# Patient Record
Sex: Female | Born: 1935 | Race: White | Hispanic: No | Smoking: Never smoker
Health system: Southern US, Community
[De-identification: ages and names within clinical notes are randomized; demographics above are authoritative.]

## PROBLEM LIST (undated history)

## (undated) DIAGNOSIS — F419 Anxiety disorder, unspecified: Secondary | ICD-10-CM

## (undated) DIAGNOSIS — I10 Essential (primary) hypertension: Secondary | ICD-10-CM

## (undated) DIAGNOSIS — F32A Depression, unspecified: Secondary | ICD-10-CM

## (undated) DIAGNOSIS — K219 Gastro-esophageal reflux disease without esophagitis: Secondary | ICD-10-CM

## (undated) DIAGNOSIS — R5381 Other malaise: Secondary | ICD-10-CM

## (undated) DIAGNOSIS — N390 Urinary tract infection, site not specified: Secondary | ICD-10-CM

## (undated) DIAGNOSIS — K3189 Other diseases of stomach and duodenum: Secondary | ICD-10-CM

## (undated) DIAGNOSIS — G4700 Insomnia, unspecified: Secondary | ICD-10-CM

## (undated) DIAGNOSIS — K5792 Diverticulitis of intestine, part unspecified, without perforation or abscess without bleeding: Secondary | ICD-10-CM

## (undated) DIAGNOSIS — K529 Noninfective gastroenteritis and colitis, unspecified: Secondary | ICD-10-CM

## (undated) DIAGNOSIS — F1110 Opioid abuse, uncomplicated: Secondary | ICD-10-CM

## (undated) DIAGNOSIS — F329 Major depressive disorder, single episode, unspecified: Secondary | ICD-10-CM

## (undated) DIAGNOSIS — F39 Unspecified mood [affective] disorder: Secondary | ICD-10-CM

## (undated) DIAGNOSIS — N179 Acute kidney failure, unspecified: Secondary | ICD-10-CM

## (undated) DIAGNOSIS — E039 Hypothyroidism, unspecified: Secondary | ICD-10-CM

## (undated) DIAGNOSIS — R52 Pain, unspecified: Secondary | ICD-10-CM

## (undated) DIAGNOSIS — Z8719 Personal history of other diseases of the digestive system: Secondary | ICD-10-CM

## (undated) HISTORY — PX: COLON RESECTION: SHX5231

## (undated) HISTORY — DX: Hypothyroidism, unspecified: E03.9

## (undated) HISTORY — DX: Essential (primary) hypertension: I10

## (undated) HISTORY — DX: Major depressive disorder, single episode, unspecified: F32.9

## (undated) HISTORY — DX: Gastro-esophageal reflux disease without esophagitis: K21.9

## (undated) HISTORY — DX: Other diseases of stomach and duodenum: K31.89

## (undated) HISTORY — DX: Diverticulitis of intestine, part unspecified, without perforation or abscess without bleeding: K57.92

## (undated) HISTORY — DX: Depression, unspecified: F32.A

---

## 1998-07-10 ENCOUNTER — Other Ambulatory Visit: Admission: RE | Admit: 1998-07-10 | Discharge: 1998-07-10 | Payer: Self-pay | Admitting: Gastroenterology

## 1998-07-12 ENCOUNTER — Encounter: Payer: Self-pay | Admitting: Gastroenterology

## 1998-07-12 ENCOUNTER — Ambulatory Visit (HOSPITAL_COMMUNITY): Admission: RE | Admit: 1998-07-12 | Discharge: 1998-07-12 | Payer: Self-pay | Admitting: Gastroenterology

## 1999-10-13 ENCOUNTER — Emergency Department (HOSPITAL_COMMUNITY): Admission: EM | Admit: 1999-10-13 | Discharge: 1999-10-13 | Payer: Self-pay | Admitting: Emergency Medicine

## 2000-06-04 ENCOUNTER — Encounter: Payer: Self-pay | Admitting: Emergency Medicine

## 2000-06-04 ENCOUNTER — Inpatient Hospital Stay (HOSPITAL_COMMUNITY): Admission: EM | Admit: 2000-06-04 | Discharge: 2000-06-06 | Payer: Self-pay | Admitting: Emergency Medicine

## 2000-06-05 ENCOUNTER — Encounter: Payer: Self-pay | Admitting: Internal Medicine

## 2000-11-12 ENCOUNTER — Encounter: Payer: Self-pay | Admitting: Gastroenterology

## 2000-11-12 ENCOUNTER — Encounter: Admission: RE | Admit: 2000-11-12 | Discharge: 2000-11-12 | Payer: Self-pay | Admitting: Gastroenterology

## 2001-01-31 ENCOUNTER — Inpatient Hospital Stay (HOSPITAL_COMMUNITY): Admission: EM | Admit: 2001-01-31 | Discharge: 2001-02-04 | Payer: Self-pay | Admitting: Emergency Medicine

## 2001-01-31 ENCOUNTER — Encounter: Payer: Self-pay | Admitting: Emergency Medicine

## 2001-02-01 ENCOUNTER — Encounter: Payer: Self-pay | Admitting: Internal Medicine

## 2001-09-19 ENCOUNTER — Inpatient Hospital Stay (HOSPITAL_COMMUNITY): Admission: EM | Admit: 2001-09-19 | Discharge: 2001-09-21 | Payer: Self-pay | Admitting: Emergency Medicine

## 2001-09-19 ENCOUNTER — Encounter: Payer: Self-pay | Admitting: Emergency Medicine

## 2001-09-20 ENCOUNTER — Encounter: Payer: Self-pay | Admitting: Gastroenterology

## 2001-09-21 ENCOUNTER — Encounter: Payer: Self-pay | Admitting: Gastroenterology

## 2001-09-21 ENCOUNTER — Encounter: Payer: Self-pay | Admitting: Internal Medicine

## 2001-09-30 ENCOUNTER — Encounter: Payer: Self-pay | Admitting: Gastroenterology

## 2001-09-30 ENCOUNTER — Inpatient Hospital Stay (HOSPITAL_COMMUNITY): Admission: EM | Admit: 2001-09-30 | Discharge: 2001-10-07 | Payer: Self-pay | Admitting: Emergency Medicine

## 2001-10-01 ENCOUNTER — Encounter: Payer: Self-pay | Admitting: Gastroenterology

## 2001-10-01 ENCOUNTER — Encounter: Payer: Self-pay | Admitting: Family Medicine

## 2001-10-02 ENCOUNTER — Encounter: Payer: Self-pay | Admitting: Gastroenterology

## 2001-10-03 ENCOUNTER — Encounter: Payer: Self-pay | Admitting: Gastroenterology

## 2001-10-04 ENCOUNTER — Encounter: Payer: Self-pay | Admitting: Gastroenterology

## 2001-10-08 ENCOUNTER — Encounter (INDEPENDENT_AMBULATORY_CARE_PROVIDER_SITE_OTHER): Payer: Self-pay | Admitting: *Deleted

## 2001-10-08 ENCOUNTER — Encounter: Payer: Self-pay | Admitting: Internal Medicine

## 2001-10-08 ENCOUNTER — Inpatient Hospital Stay (HOSPITAL_COMMUNITY): Admission: EM | Admit: 2001-10-08 | Discharge: 2001-10-11 | Payer: Self-pay | Admitting: *Deleted

## 2001-10-09 ENCOUNTER — Encounter: Payer: Self-pay | Admitting: Internal Medicine

## 2001-10-13 ENCOUNTER — Encounter: Admission: RE | Admit: 2001-10-13 | Discharge: 2001-10-13 | Payer: Self-pay | Admitting: Gastroenterology

## 2001-10-13 ENCOUNTER — Encounter: Payer: Self-pay | Admitting: Gastroenterology

## 2002-02-24 ENCOUNTER — Encounter: Payer: Self-pay | Admitting: Emergency Medicine

## 2002-02-24 ENCOUNTER — Emergency Department (HOSPITAL_COMMUNITY): Admission: EM | Admit: 2002-02-24 | Discharge: 2002-02-24 | Payer: Self-pay | Admitting: Emergency Medicine

## 2002-02-25 ENCOUNTER — Inpatient Hospital Stay (HOSPITAL_COMMUNITY): Admission: AD | Admit: 2002-02-25 | Discharge: 2002-03-01 | Payer: Self-pay | Admitting: Gastroenterology

## 2002-02-26 ENCOUNTER — Encounter: Payer: Self-pay | Admitting: Gastroenterology

## 2003-04-10 ENCOUNTER — Observation Stay (HOSPITAL_COMMUNITY): Admission: AD | Admit: 2003-04-10 | Discharge: 2003-04-11 | Payer: Self-pay | Admitting: Emergency Medicine

## 2003-09-10 ENCOUNTER — Inpatient Hospital Stay (HOSPITAL_COMMUNITY): Admission: EM | Admit: 2003-09-10 | Discharge: 2003-09-12 | Payer: Self-pay | Admitting: Emergency Medicine

## 2003-09-11 ENCOUNTER — Encounter: Payer: Self-pay | Admitting: Internal Medicine

## 2003-09-12 ENCOUNTER — Encounter (INDEPENDENT_AMBULATORY_CARE_PROVIDER_SITE_OTHER): Payer: Self-pay | Admitting: Specialist

## 2003-10-06 ENCOUNTER — Inpatient Hospital Stay (HOSPITAL_COMMUNITY): Admission: EM | Admit: 2003-10-06 | Discharge: 2003-10-07 | Payer: Self-pay

## 2004-02-14 ENCOUNTER — Ambulatory Visit: Payer: Self-pay | Admitting: Gastroenterology

## 2004-02-15 ENCOUNTER — Inpatient Hospital Stay (HOSPITAL_COMMUNITY): Admission: EM | Admit: 2004-02-15 | Discharge: 2004-02-19 | Payer: Self-pay | Admitting: Emergency Medicine

## 2004-03-04 ENCOUNTER — Ambulatory Visit: Payer: Self-pay | Admitting: Gastroenterology

## 2004-04-09 ENCOUNTER — Encounter: Admission: RE | Admit: 2004-04-09 | Discharge: 2004-04-09 | Payer: Self-pay | Admitting: Surgery

## 2004-05-03 ENCOUNTER — Ambulatory Visit: Payer: Self-pay | Admitting: Family Medicine

## 2004-05-05 ENCOUNTER — Inpatient Hospital Stay (HOSPITAL_COMMUNITY): Admission: EM | Admit: 2004-05-05 | Discharge: 2004-05-07 | Payer: Self-pay | Admitting: *Deleted

## 2004-05-14 ENCOUNTER — Ambulatory Visit: Payer: Self-pay | Admitting: Family Medicine

## 2004-05-15 ENCOUNTER — Ambulatory Visit: Payer: Self-pay

## 2004-07-01 ENCOUNTER — Ambulatory Visit: Payer: Self-pay | Admitting: Family Medicine

## 2004-09-02 ENCOUNTER — Inpatient Hospital Stay (HOSPITAL_COMMUNITY): Admission: EM | Admit: 2004-09-02 | Discharge: 2004-09-05 | Payer: Self-pay | Admitting: Emergency Medicine

## 2004-09-03 ENCOUNTER — Ambulatory Visit: Payer: Self-pay | Admitting: Internal Medicine

## 2004-09-23 ENCOUNTER — Ambulatory Visit: Payer: Self-pay | Admitting: Family Medicine

## 2004-09-27 ENCOUNTER — Ambulatory Visit: Payer: Self-pay | Admitting: Family Medicine

## 2004-10-10 ENCOUNTER — Encounter: Admission: RE | Admit: 2004-10-10 | Discharge: 2004-10-10 | Payer: Self-pay | Admitting: Surgery

## 2004-11-28 ENCOUNTER — Inpatient Hospital Stay (HOSPITAL_COMMUNITY): Admission: EM | Admit: 2004-11-28 | Discharge: 2004-12-01 | Payer: Self-pay | Admitting: *Deleted

## 2004-11-29 ENCOUNTER — Encounter: Payer: Self-pay | Admitting: Internal Medicine

## 2004-12-01 ENCOUNTER — Ambulatory Visit: Payer: Self-pay | Admitting: *Deleted

## 2004-12-03 ENCOUNTER — Ambulatory Visit: Payer: Self-pay | Admitting: Family Medicine

## 2004-12-10 ENCOUNTER — Ambulatory Visit: Payer: Self-pay | Admitting: Family Medicine

## 2004-12-16 ENCOUNTER — Inpatient Hospital Stay (HOSPITAL_COMMUNITY): Admission: EM | Admit: 2004-12-16 | Discharge: 2004-12-20 | Payer: Self-pay | Admitting: Emergency Medicine

## 2004-12-19 ENCOUNTER — Ambulatory Visit: Payer: Self-pay | Admitting: Gastroenterology

## 2004-12-20 ENCOUNTER — Encounter: Payer: Self-pay | Admitting: Gastroenterology

## 2005-01-13 ENCOUNTER — Ambulatory Visit: Payer: Self-pay | Admitting: Family Medicine

## 2005-03-24 ENCOUNTER — Inpatient Hospital Stay (HOSPITAL_COMMUNITY): Admission: EM | Admit: 2005-03-24 | Discharge: 2005-03-25 | Payer: Self-pay | Admitting: Emergency Medicine

## 2005-04-28 ENCOUNTER — Ambulatory Visit: Payer: Self-pay | Admitting: Family Medicine

## 2005-07-24 ENCOUNTER — Ambulatory Visit: Payer: Self-pay | Admitting: Family Medicine

## 2005-08-04 ENCOUNTER — Ambulatory Visit: Payer: Self-pay | Admitting: Family Medicine

## 2005-09-02 ENCOUNTER — Ambulatory Visit: Payer: Self-pay | Admitting: Internal Medicine

## 2005-09-22 ENCOUNTER — Emergency Department (HOSPITAL_COMMUNITY): Admission: EM | Admit: 2005-09-22 | Discharge: 2005-09-23 | Payer: Self-pay | Admitting: Emergency Medicine

## 2005-09-22 ENCOUNTER — Ambulatory Visit: Payer: Self-pay | Admitting: Family Medicine

## 2005-09-23 ENCOUNTER — Ambulatory Visit: Payer: Self-pay | Admitting: Family Medicine

## 2005-09-23 ENCOUNTER — Ambulatory Visit: Payer: Self-pay | Admitting: *Deleted

## 2005-09-24 ENCOUNTER — Inpatient Hospital Stay (HOSPITAL_COMMUNITY): Admission: EM | Admit: 2005-09-24 | Discharge: 2005-09-27 | Payer: Self-pay | Admitting: Emergency Medicine

## 2005-09-30 ENCOUNTER — Ambulatory Visit: Payer: Self-pay | Admitting: Family Medicine

## 2005-10-06 ENCOUNTER — Ambulatory Visit: Payer: Self-pay | Admitting: Family Medicine

## 2005-10-22 ENCOUNTER — Ambulatory Visit: Payer: Self-pay | Admitting: Family Medicine

## 2005-10-30 ENCOUNTER — Ambulatory Visit: Payer: Self-pay | Admitting: Family Medicine

## 2005-11-10 ENCOUNTER — Ambulatory Visit: Payer: Self-pay | Admitting: Family Medicine

## 2005-11-11 ENCOUNTER — Ambulatory Visit: Payer: Self-pay | Admitting: Family Medicine

## 2005-11-18 ENCOUNTER — Encounter: Admission: RE | Admit: 2005-11-18 | Discharge: 2005-11-18 | Payer: Self-pay | Admitting: Family Medicine

## 2005-11-18 ENCOUNTER — Ambulatory Visit: Payer: Self-pay | Admitting: Family Medicine

## 2005-11-25 ENCOUNTER — Ambulatory Visit: Payer: Self-pay | Admitting: Family Medicine

## 2005-12-09 ENCOUNTER — Ambulatory Visit: Payer: Self-pay | Admitting: Family Medicine

## 2005-12-18 ENCOUNTER — Ambulatory Visit: Payer: Self-pay | Admitting: Family Medicine

## 2006-01-14 ENCOUNTER — Ambulatory Visit: Payer: Self-pay | Admitting: Internal Medicine

## 2006-01-14 ENCOUNTER — Inpatient Hospital Stay (HOSPITAL_COMMUNITY): Admission: EM | Admit: 2006-01-14 | Discharge: 2006-01-17 | Payer: Self-pay | Admitting: Emergency Medicine

## 2006-01-30 ENCOUNTER — Ambulatory Visit: Payer: Self-pay | Admitting: Family Medicine

## 2006-01-30 LAB — CONVERTED CEMR LAB
Alkaline Phosphatase: 69 units/L (ref 39–117)
BUN: 28 mg/dL — ABNORMAL HIGH (ref 6–23)
CO2: 20 meq/L (ref 19–32)
Calcium: 9.2 mg/dL (ref 8.4–10.5)
Chloride: 111 meq/L (ref 96–112)
Creatinine, Ser: 1.3 mg/dL — ABNORMAL HIGH (ref 0.4–1.2)
Folate: 12.5 ng/mL
GFR calc non Af Amer: 42 mL/min
Total Protein: 6.6 g/dL (ref 6.0–8.3)
Vitamin B-12: >1500 pg/mL — ABNORMAL HIGH (ref 211–911)

## 2006-03-02 ENCOUNTER — Ambulatory Visit: Payer: Self-pay | Admitting: Family Medicine

## 2006-04-03 DIAGNOSIS — R519 Headache, unspecified: Secondary | ICD-10-CM | POA: Insufficient documentation

## 2006-04-03 DIAGNOSIS — M810 Age-related osteoporosis without current pathological fracture: Secondary | ICD-10-CM | POA: Insufficient documentation

## 2006-04-03 DIAGNOSIS — R51 Headache: Secondary | ICD-10-CM

## 2006-04-03 DIAGNOSIS — B0229 Other postherpetic nervous system involvement: Secondary | ICD-10-CM

## 2006-04-03 DIAGNOSIS — K219 Gastro-esophageal reflux disease without esophagitis: Secondary | ICD-10-CM

## 2006-04-03 DIAGNOSIS — Z8669 Personal history of other diseases of the nervous system and sense organs: Secondary | ICD-10-CM

## 2006-04-03 DIAGNOSIS — N390 Urinary tract infection, site not specified: Secondary | ICD-10-CM

## 2006-04-03 DIAGNOSIS — K5730 Diverticulosis of large intestine without perforation or abscess without bleeding: Secondary | ICD-10-CM | POA: Insufficient documentation

## 2006-04-03 DIAGNOSIS — Z8719 Personal history of other diseases of the digestive system: Secondary | ICD-10-CM | POA: Insufficient documentation

## 2006-04-03 DIAGNOSIS — M1990 Unspecified osteoarthritis, unspecified site: Secondary | ICD-10-CM | POA: Insufficient documentation

## 2006-04-07 ENCOUNTER — Encounter: Admission: RE | Admit: 2006-04-07 | Discharge: 2006-04-07 | Payer: Self-pay | Admitting: Family Medicine

## 2006-04-07 ENCOUNTER — Ambulatory Visit: Payer: Self-pay | Admitting: Family Medicine

## 2006-04-08 ENCOUNTER — Encounter: Payer: Self-pay | Admitting: Family Medicine

## 2006-04-28 ENCOUNTER — Ambulatory Visit: Payer: Self-pay | Admitting: Family Medicine

## 2006-05-14 ENCOUNTER — Ambulatory Visit: Payer: Self-pay | Admitting: Family Medicine

## 2006-05-16 ENCOUNTER — Encounter: Payer: Self-pay | Admitting: Family Medicine

## 2006-07-13 ENCOUNTER — Ambulatory Visit: Payer: Self-pay | Admitting: Family Medicine

## 2006-07-15 ENCOUNTER — Ambulatory Visit: Payer: Self-pay | Admitting: Internal Medicine

## 2006-07-15 LAB — CONVERTED CEMR LAB
CO2: 25 meq/L (ref 19–32)
Creatinine, Ser: 1.2 mg/dL (ref 0.4–1.2)
Folate: 9.8 ng/mL
GFR calc Af Amer: 56 mL/min
Potassium: 4.8 meq/L (ref 3.5–5.1)
Sodium: 140 meq/L (ref 135–145)
TSH: 3.07 microintl units/mL (ref 0.35–5.50)

## 2006-08-13 ENCOUNTER — Encounter: Payer: Self-pay | Admitting: Family Medicine

## 2006-08-17 ENCOUNTER — Ambulatory Visit: Payer: Self-pay | Admitting: Family Medicine

## 2006-08-24 ENCOUNTER — Ambulatory Visit: Payer: Self-pay | Admitting: Internal Medicine

## 2006-08-24 ENCOUNTER — Inpatient Hospital Stay (HOSPITAL_COMMUNITY): Admission: EM | Admit: 2006-08-24 | Discharge: 2006-08-26 | Payer: Self-pay | Admitting: Emergency Medicine

## 2006-08-25 ENCOUNTER — Encounter: Payer: Self-pay | Admitting: Internal Medicine

## 2006-09-02 ENCOUNTER — Encounter: Payer: Self-pay | Admitting: Family Medicine

## 2006-09-02 DIAGNOSIS — N318 Other neuromuscular dysfunction of bladder: Secondary | ICD-10-CM

## 2006-09-04 ENCOUNTER — Ambulatory Visit: Payer: Self-pay | Admitting: Gastroenterology

## 2006-09-28 ENCOUNTER — Ambulatory Visit: Payer: Self-pay | Admitting: Internal Medicine

## 2006-09-28 LAB — CONVERTED CEMR LAB
CO2: 23 meq/L (ref 19–32)
Calcium: 9.3 mg/dL (ref 8.4–10.5)
Chloride: 108 meq/L (ref 96–112)
Glucose, Bld: 86 mg/dL (ref 70–99)

## 2006-10-28 ENCOUNTER — Ambulatory Visit: Payer: Self-pay | Admitting: Family Medicine

## 2006-10-28 ENCOUNTER — Telehealth (INDEPENDENT_AMBULATORY_CARE_PROVIDER_SITE_OTHER): Payer: Self-pay | Admitting: *Deleted

## 2006-10-28 DIAGNOSIS — M79609 Pain in unspecified limb: Secondary | ICD-10-CM | POA: Insufficient documentation

## 2006-10-28 LAB — CONVERTED CEMR LAB
Bilirubin Urine: NEGATIVE
Ketones, urine, test strip: NEGATIVE
Protein, U semiquant: NEGATIVE
Urobilinogen, UA: NEGATIVE

## 2006-10-29 DIAGNOSIS — S92909A Unspecified fracture of unspecified foot, initial encounter for closed fracture: Secondary | ICD-10-CM | POA: Insufficient documentation

## 2006-11-02 ENCOUNTER — Encounter: Payer: Self-pay | Admitting: Family Medicine

## 2007-01-12 ENCOUNTER — Ambulatory Visit (HOSPITAL_BASED_OUTPATIENT_CLINIC_OR_DEPARTMENT_OTHER): Admission: RE | Admit: 2007-01-12 | Discharge: 2007-01-12 | Payer: Self-pay | Admitting: Urology

## 2007-02-09 ENCOUNTER — Telehealth (INDEPENDENT_AMBULATORY_CARE_PROVIDER_SITE_OTHER): Payer: Self-pay | Admitting: *Deleted

## 2007-02-22 ENCOUNTER — Ambulatory Visit: Payer: Self-pay | Admitting: Family Medicine

## 2007-02-22 DIAGNOSIS — M545 Low back pain: Secondary | ICD-10-CM | POA: Insufficient documentation

## 2007-02-22 DIAGNOSIS — R5383 Other fatigue: Secondary | ICD-10-CM

## 2007-02-22 DIAGNOSIS — R5381 Other malaise: Secondary | ICD-10-CM | POA: Insufficient documentation

## 2007-02-23 ENCOUNTER — Encounter: Payer: Self-pay | Admitting: Family Medicine

## 2007-02-24 LAB — CONVERTED CEMR LAB
BUN: 22 mg/dL (ref 6–23)
CO2: 22 meq/L (ref 19–32)
Creatinine, Ser: 1.3 mg/dL — ABNORMAL HIGH (ref 0.4–1.2)
Eosinophils Absolute: 1.7 10*3/uL — ABNORMAL HIGH (ref 0.0–0.6)
Eosinophils Relative: 18.8 % — ABNORMAL HIGH (ref 0.0–5.0)
HCT: 33.6 % — ABNORMAL LOW (ref 36.0–46.0)
Hemoglobin: 11.6 g/dL — ABNORMAL LOW (ref 12.0–15.0)
Lymphocytes Relative: 19 % (ref 12.0–46.0)
MCV: 94.2 fL (ref 78.0–100.0)
Neutro Abs: 4.8 10*3/uL (ref 1.4–7.7)
Neutrophils Relative %: 54.8 % (ref 43.0–77.0)
Potassium: 4.6 meq/L (ref 3.5–5.1)
Sodium: 134 meq/L — ABNORMAL LOW (ref 135–145)
WBC: 8.8 10*3/uL (ref 4.5–10.5)

## 2007-03-04 ENCOUNTER — Telehealth: Payer: Self-pay | Admitting: Internal Medicine

## 2007-03-08 ENCOUNTER — Ambulatory Visit: Payer: Self-pay | Admitting: Internal Medicine

## 2007-03-08 DIAGNOSIS — M4120 Other idiopathic scoliosis, site unspecified: Secondary | ICD-10-CM | POA: Insufficient documentation

## 2007-03-09 ENCOUNTER — Encounter: Payer: Self-pay | Admitting: Internal Medicine

## 2007-03-15 ENCOUNTER — Encounter (INDEPENDENT_AMBULATORY_CARE_PROVIDER_SITE_OTHER): Payer: Self-pay | Admitting: *Deleted

## 2007-04-01 ENCOUNTER — Telehealth (INDEPENDENT_AMBULATORY_CARE_PROVIDER_SITE_OTHER): Payer: Self-pay | Admitting: *Deleted

## 2007-05-13 ENCOUNTER — Telehealth (INDEPENDENT_AMBULATORY_CARE_PROVIDER_SITE_OTHER): Payer: Self-pay | Admitting: *Deleted

## 2007-05-18 ENCOUNTER — Telehealth (INDEPENDENT_AMBULATORY_CARE_PROVIDER_SITE_OTHER): Payer: Self-pay | Admitting: *Deleted

## 2007-05-24 ENCOUNTER — Ambulatory Visit: Payer: Self-pay | Admitting: Family Medicine

## 2007-05-24 DIAGNOSIS — I7789 Other specified disorders of arteries and arterioles: Secondary | ICD-10-CM

## 2007-05-24 DIAGNOSIS — E039 Hypothyroidism, unspecified: Secondary | ICD-10-CM | POA: Insufficient documentation

## 2007-05-26 ENCOUNTER — Encounter (INDEPENDENT_AMBULATORY_CARE_PROVIDER_SITE_OTHER): Payer: Self-pay | Admitting: *Deleted

## 2007-05-27 ENCOUNTER — Telehealth (INDEPENDENT_AMBULATORY_CARE_PROVIDER_SITE_OTHER): Payer: Self-pay | Admitting: *Deleted

## 2007-05-27 LAB — CONVERTED CEMR LAB: Free T4: 0.8 ng/dL (ref 0.6–1.6)

## 2007-06-01 ENCOUNTER — Telehealth (INDEPENDENT_AMBULATORY_CARE_PROVIDER_SITE_OTHER): Payer: Self-pay | Admitting: *Deleted

## 2007-06-03 ENCOUNTER — Telehealth (INDEPENDENT_AMBULATORY_CARE_PROVIDER_SITE_OTHER): Payer: Self-pay | Admitting: *Deleted

## 2007-06-04 ENCOUNTER — Telehealth (INDEPENDENT_AMBULATORY_CARE_PROVIDER_SITE_OTHER): Payer: Self-pay | Admitting: *Deleted

## 2007-06-08 ENCOUNTER — Encounter: Payer: Self-pay | Admitting: Family Medicine

## 2007-07-02 DIAGNOSIS — K2980 Duodenitis without bleeding: Secondary | ICD-10-CM | POA: Insufficient documentation

## 2007-07-02 DIAGNOSIS — J309 Allergic rhinitis, unspecified: Secondary | ICD-10-CM | POA: Insufficient documentation

## 2007-07-02 DIAGNOSIS — K56609 Unspecified intestinal obstruction, unspecified as to partial versus complete obstruction: Secondary | ICD-10-CM | POA: Insufficient documentation

## 2007-07-02 DIAGNOSIS — R7309 Other abnormal glucose: Secondary | ICD-10-CM | POA: Insufficient documentation

## 2007-07-02 DIAGNOSIS — K277 Chronic peptic ulcer, site unspecified, without hemorrhage or perforation: Secondary | ICD-10-CM | POA: Insufficient documentation

## 2007-07-02 DIAGNOSIS — G2581 Restless legs syndrome: Secondary | ICD-10-CM

## 2007-07-03 ENCOUNTER — Encounter: Payer: Self-pay | Admitting: Family Medicine

## 2007-07-05 ENCOUNTER — Encounter: Payer: Self-pay | Admitting: Family Medicine

## 2007-10-07 ENCOUNTER — Ambulatory Visit (HOSPITAL_BASED_OUTPATIENT_CLINIC_OR_DEPARTMENT_OTHER): Admission: RE | Admit: 2007-10-07 | Discharge: 2007-10-07 | Payer: Self-pay | Admitting: Urology

## 2007-11-04 IMAGING — CT CT PELVIS W/ CM
1 of 3 series · 14 of 32 positions shown, 19 images · IV contrast (omnipaque)
Comparison: 01/14/2006

ABDOMEN CT WITH CONTRAST:

CLINICAL DATA: Abdominal pain and vomiting
TECHNIQUE: Multidetector CT imaging of the abdomen and pelvis was performed
following the standard protocol during bolus administration of intravenous
contrast.

Contrast:  100 cc Omnipaque 300

[Series 2: abd_pel 5.0 b40f st · axial · 0.59mm/px · z∈[-326,+40]mm · 14 of 83 slices shown, 19 images]
[im 5/83  soft-tissue]
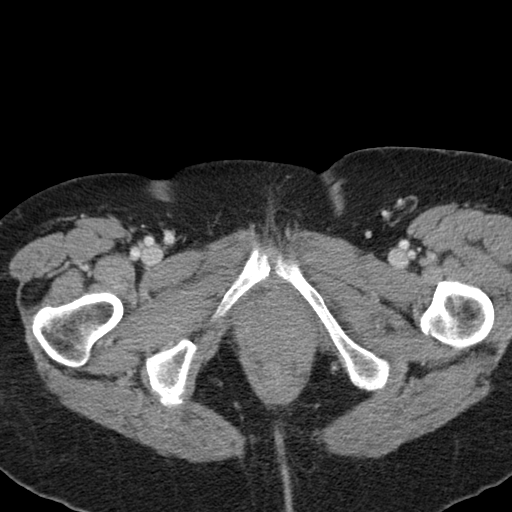
[im 5/83  bone]
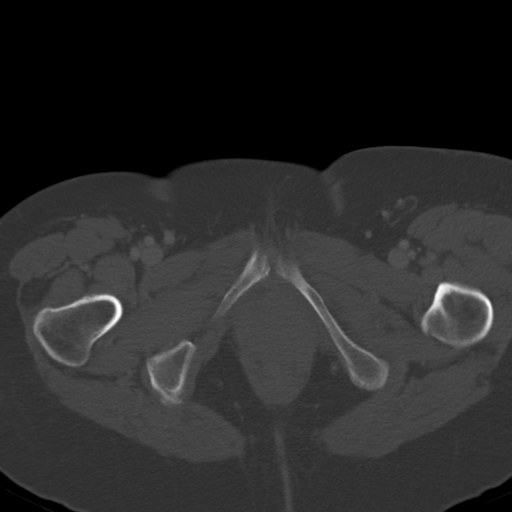
[im 10/83  soft-tissue]
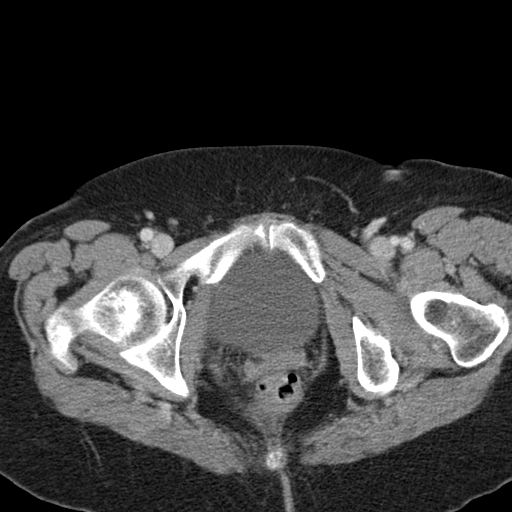
[im 19/83  soft-tissue]
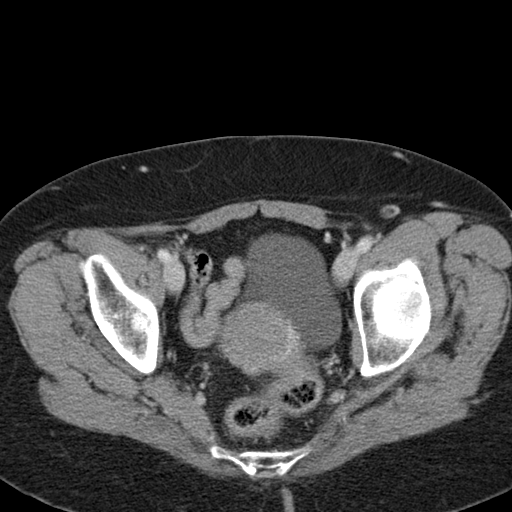
[im 23/83  soft-tissue]
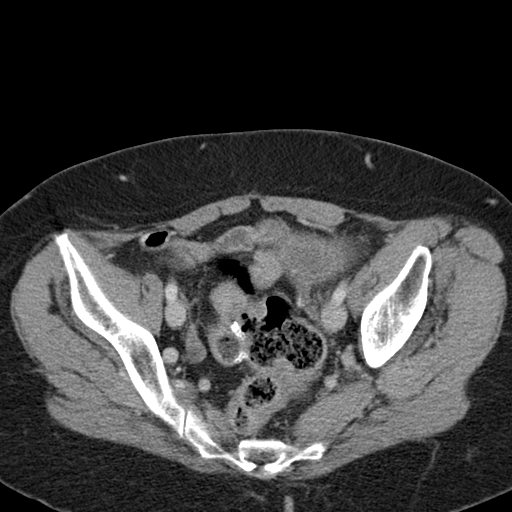
[im 28/83  soft-tissue]
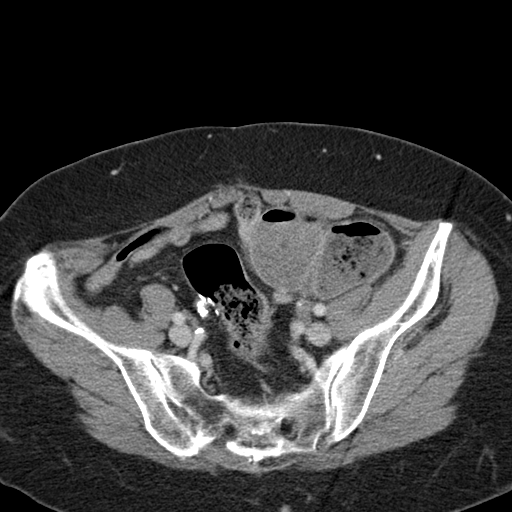
[im 37/83  soft-tissue]
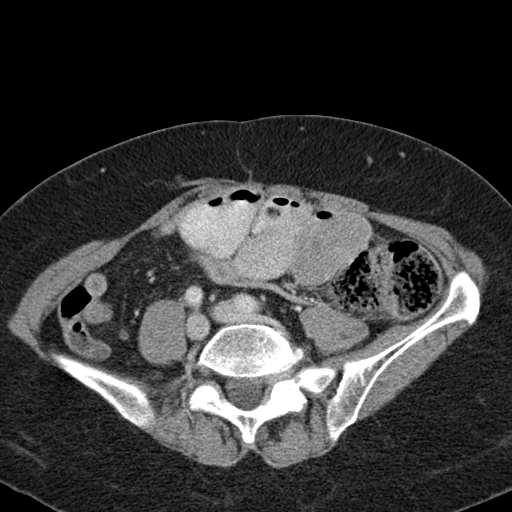
[im 42/83  soft-tissue]
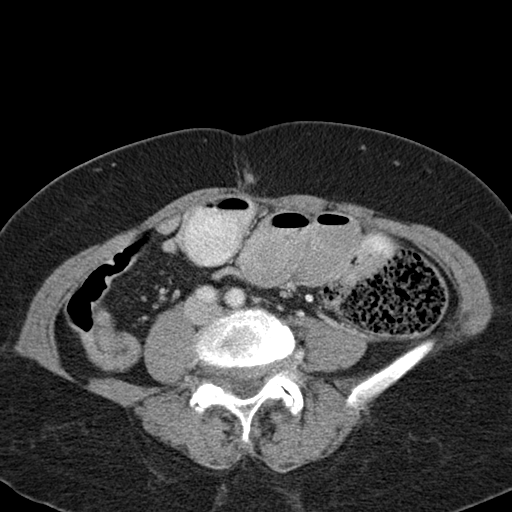
[im 46/83  soft-tissue]
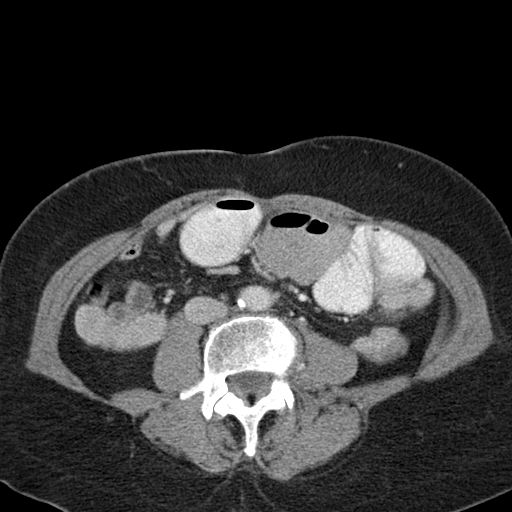
[im 55/83  soft-tissue]
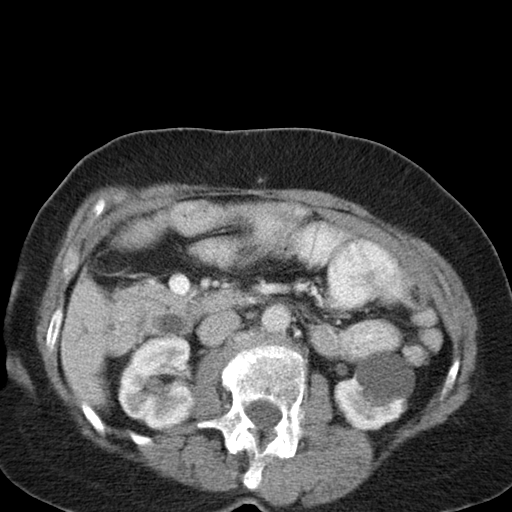
[im 55/83  bone]
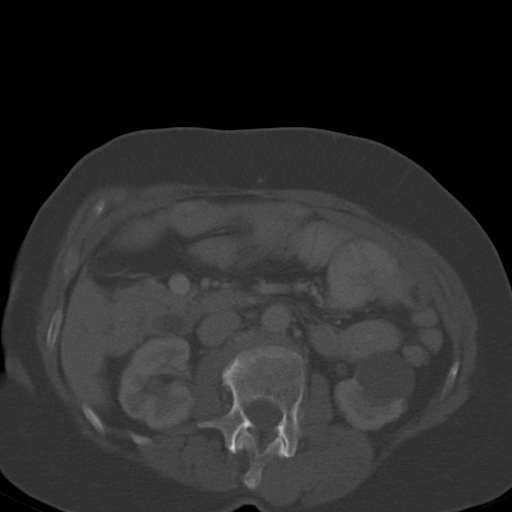
[im 60/83  soft-tissue]
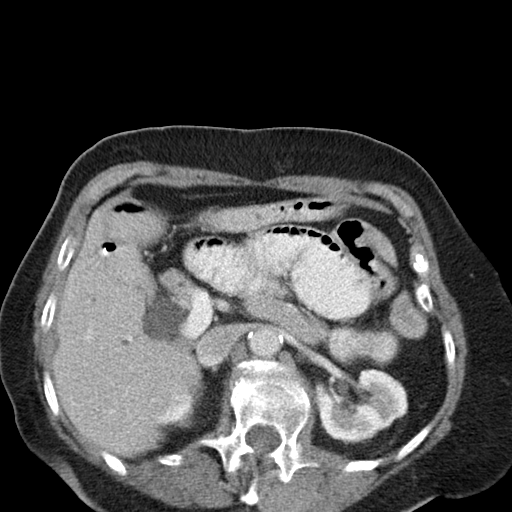
[im 64/83  soft-tissue]
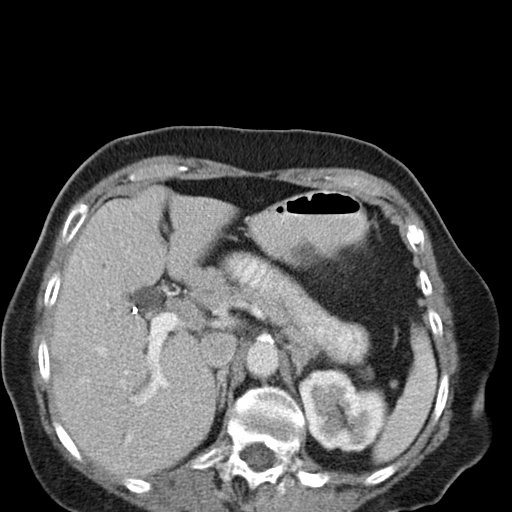
[im 64/83  lung]
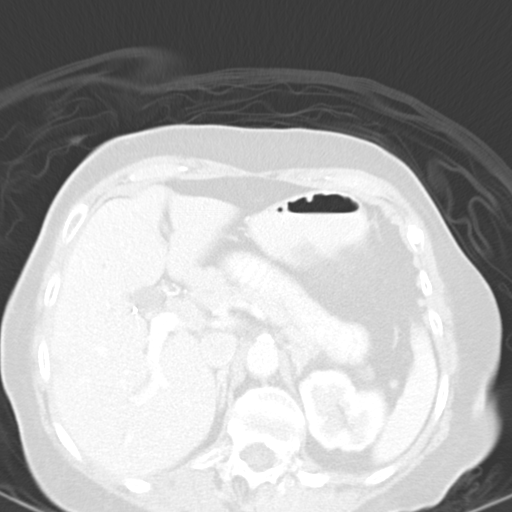
[im 69/83  lung]
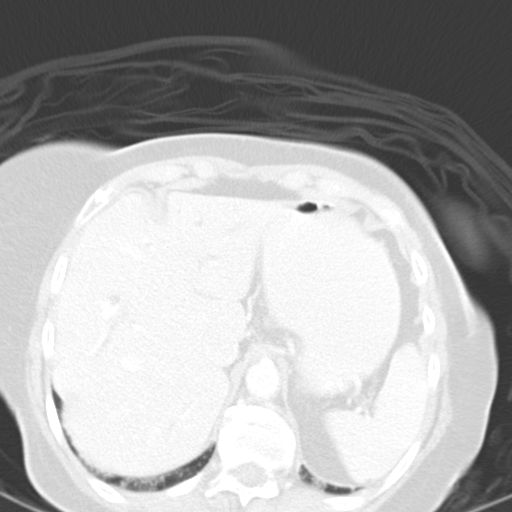
[im 73/83  soft-tissue]
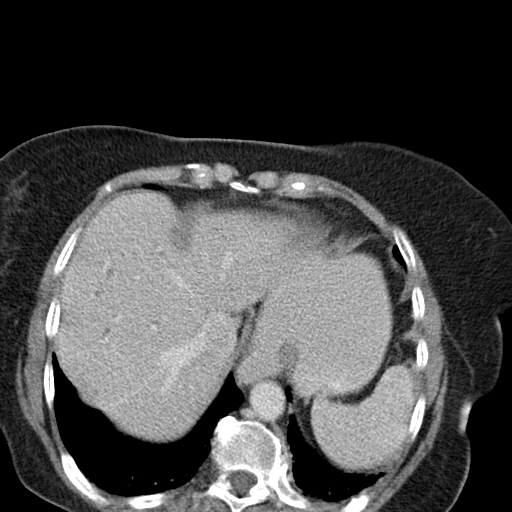
[im 73/83  lung]
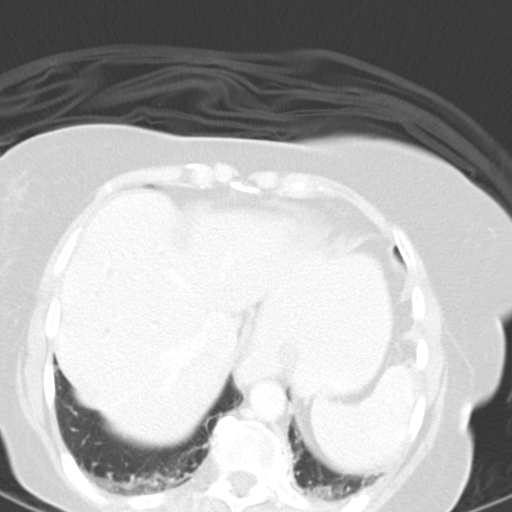
[im 78/83  soft-tissue]
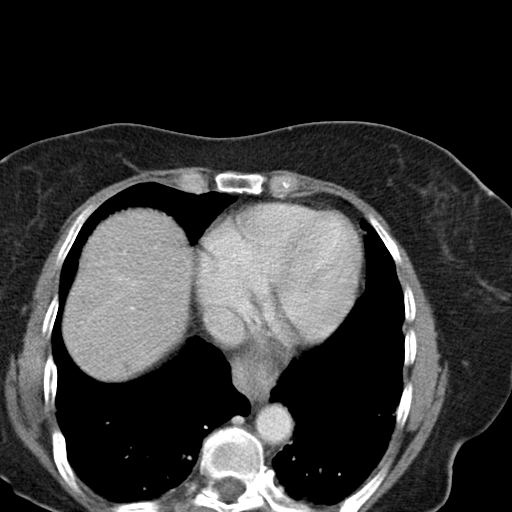
[im 78/83  lung]
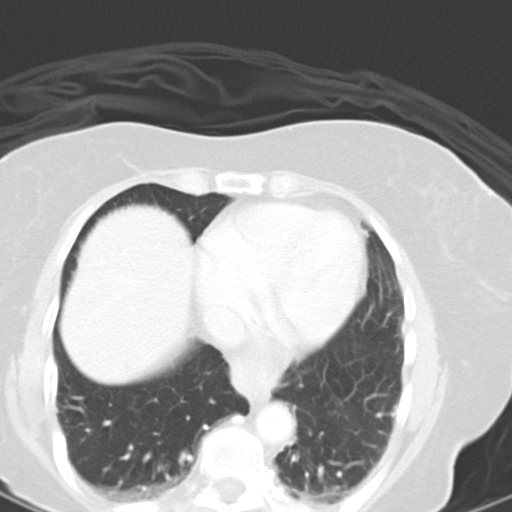

[14 of 32 positions shown; findings below may reference images not displayed]

FINDINGS: Intra and extrahepatic biliary dilatation seen on the previous study
persists, and is unchanged. Tiny low density lesion in the dome of the liver is
stable. No focal abnormality is seen in the spleen. Small to moderate hiatal
hernia noted. Patient is status post a subtotal colectomy with ileocolic
anastomosis noted at the level of the sigmoid colon.

The pancreas is unremarkable. The duodenal c-loop does not cross the midline as
expected, raising the question of malrotation. Dilated small bowel loops are
seen in the left abdomen. The patient has bilateral renal cysts with bilateral
renal scarring.
IMPRESSION: Small bowel dilation in the left abdomen.

PELVIS CT WITH CONTRAST:
FINDINGS: The patient has dilated small bowel in the anterior left lower
quadrant with a small bowel feces sign evident. The appearance is quite similar
to the previous exam. And ileocolic anastomosis is identified in the central
anatomic pelvis although the small bowel leading into the anastomosis is not
clearly visualized. Although a discrete transition point cannot be identified,
given the transition from opacified dilated small bowel to dilated unopacified
small bowel to dilated stool-filled small bowel , the level of obstruction is
probably in the left anatomic pelvis.

Bone windows are unremarkable.
IMPRESSION: Dilated small bowel in the left lower quadrant with an associated small bowel
feces sign. This is compatible with small bowel obstruction, although it is not
entirely clear whether this presents a complete mechanical obstruction or a
partial small bowel obstruction. The level of obstruction does appear proximal
to the ileocolic anastomosis. Imaging features are very similar to the previous
exam.

## 2008-06-22 ENCOUNTER — Ambulatory Visit (HOSPITAL_BASED_OUTPATIENT_CLINIC_OR_DEPARTMENT_OTHER): Admission: RE | Admit: 2008-06-22 | Discharge: 2008-06-22 | Payer: Self-pay | Admitting: Urology

## 2010-04-07 ENCOUNTER — Encounter: Payer: Self-pay | Admitting: Family Medicine

## 2010-06-26 LAB — POCT HEMOGLOBIN-HEMACUE: Hemoglobin: 14.4 g/dL (ref 12.0–15.0)

## 2010-07-30 NOTE — Assessment & Plan Note (Signed)
[facility]                         GASTROENTEROLOGY OFFICE NOTE   NAME:Miller, Janice RUMBAUGH                      MRN:          [id number]  DATE:09/28/2006                            DOB:          Mar 03, 1927    PROBLEMS:  See discharge summary of August 26, 2006 as well as Dr.  Blossom Hoops last note in 2005.   She is here to follow up after another admission for presumed small  bowel obstruction.  At that time, it was probably an ileus related to  the use of Enablex.  She is better and back to her normal 3 loose bowel  movements a day that are controllable.  She is concerned that she is  gaining weight.  She is 154 pounds.  In 2006 she was 161 pounds.  She  has been tired and drained.  Review of her labs in the hospital showed a  normal hemoglobin in June.  Her TSH was normal at the end of April.  She  did have renal insufficiency with a creatinine of 1.4.  She says she has  to have her labs drawn over here because of poor veins.   MEDICATIONS:  Are listed and reviewed in the chart.   PAST MEDICAL HISTORY:  As per problem list as described above.  This is  reviewed.   REVIEW OF SYSTEMS:  She still has urinary frequency and incontinence  issues. Not having any significant abdominal pain.   PHYSICAL:  Height 5 feet 1 inch.  Weight 154 pounds, pulse 72, blood  pressure 124/60.  The lower extremities are free of edema.   ASSESSMENT:  1. Chronic recurrent ileus and small bowel obstruction problems after      multiple surgical resections including a subtotal colectomy.  2. Chronic diarrhea symptoms, controlled.  3. Fatigue, some weight gain and fluctuation.  I suspect she is now      just returning after multiple illnesses, to a normal weight, she is      overweight with a BMI of 28, but she is under where she was a few      years ago.  I am not too concerned about the weight issue at this      point and I tried to reassure her.   PLAN:  Check a TSH,  check a BMET because of her creatinine problems.  She will have ongoing medical followup with Dr. Laury Axon and she will  follow up with Dr. Larey Dresser regarding her urinary tract problems.   She will return to me as needed at this point.     Iva Boop, MD,FACG  Electronically Signed    CEG/MedQ  DD: 09/28/2006  DT: 09/28/2006  Job #: [id number]   cc:   Maretta Bees. Vonita Moss, M.D.  Lelon Perla, DO
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
## 2010-07-30 NOTE — Op Note (Signed)
NAME:  [name], [name]               ACCOUNT NO.:  [id number]   MEDICAL RECORD NO.:  [id number]          PATIENT TYPE:  AMB   LOCATION:  [facility]                         FACILITY:  [facility]   PHYSICIAN:  Martina Sinner, MD DATE OF BIRTH:  September 16, 1926   DATE OF PROCEDURE:  06/22/2008  DATE OF DISCHARGE:                               OPERATIVE REPORT   PREOPERATIVE DIAGNOSES:  1. Neurogenic bladder.  2. Refractory urge incontinence.  3. Muscle spasms.   POSTOPERATIVE DIAGNOSES:  1. Neurogenic bladder.  2. Refractory urge incontinence.  3. Muscle spasms surgery cystoscopy, hydrodistention, Botox injection.   INDICATIONS FOR PROCEDURE:  The patient does very well with Botox.  She  has had breakthrough infections causing worsening urge incontinence.  She is asymptomatic today in regards to infection.   She has a long bowel history where she can have nausea and decreased  bowel movements and partial bowel obstructions.  This has been going on  for years but she really has not had a lot of trouble for the last 18  months.  She does not have a Development worker, international aid in [place] or [place]  currently.  She started having these symptoms last night.  She is  passing gas and had a small bowel movement this morning.  She is having  minimal pain.  Her and her son really want to go ahead with the  procedure and anesthesia and I both thought it was safe.  I gave her a  Dulcolax suppository in surgery.   DESCRIPTION OF PROCEDURE:  The patient underwent her procedure with the  ACMI scope.  Bladder mucosa and trigone were normal.  There is no  stitch, foreign body or carcinoma.  She is hydro distended to 600 mL.  Bladder was emptied.  There is no glomerulations.  With the bladder  partially full I injected 20 mL of saline with 200 units of Botox using  my usual template of 5 and 7 o'clock and cephalad to the interureteric  ridge.  There is no bleeding.  Bladder was emptied.  The patient is  taken to  recovery and was given preoperative ciprofloxacin.  She will  stay on Macrodantin daily.  I will see her as per protocol.           ______________________________  Martina Sinner, MD  Electronically Signed     SAM/MEDQ  D:  06/22/2008  T:  06/22/2008  Job:  [id number]

## 2010-07-30 NOTE — Discharge Summary (Signed)
[name], [name]               ACCOUNT NO.:  [id number]   MEDICAL RECORD NO.:  [id number]          PATIENT TYPE:  INP   LOCATION:  1321                         FACILITY:  [facility]   PHYSICIAN:  Valerie A. Felicity Coyer, MDDATE OF BIRTH:  [birth year]   DATE OF ADMISSION:  08/24/2006  DATE OF DISCHARGE:  08/26/2006                               DISCHARGE SUMMARY   DISCHARGE DIAGNOSES:  1. Recurrent partial small bowel obstruction by CT versus clinical      ileus (see details below), status post conservative medical      treatment with resolution.  2. Escherichia coli urinary tract infection, to treat with Cipro x5      days (see details below).  3. Nonspecific mild elevation of amylase and lipase (see below).  4. Hypothyroidism.  5. Restless leg syndrome  6. Elevated blood pressure at presentation, resolved without      treatment.  7. Osteoporosis.  8. Chronic benzodiazepine use.  9. History of idiopathic pancreatitis.  10.Gastroesophageal reflux disease.  11.History of subtotal colectomy in 1993 secondary to diverticulitis.  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  13.Status post cholecystectomy.   DISCHARGE MEDICATIONS:  1. Cipro 500 mg p.o. b.i.d. x5 days (prescription provided at time of      discharge).  (The patient is discontinued on her Enablex 7.5 mg daily which was begun  2 weeks ago for chronic urinary urgency with incontinence); doubt, but  cannot exclude, exacerbation of above symptoms due to this medication).  Other medications are as prior to admission without change and include:  1. Requip 1 mg p.o. t.i.d.  2. Klonopin 1.5 mg q.h.s.  3. Actonel 35 mg weekly.  4. Zyrtec 10 mg p.o. daily.  5. Synthroid 25 mcg daily.  6. Celebrex 200 mg p.o. b.i.d.  7. Prozac 40 mg daily.  8. Darvocet p.r.n.   DISPOSITION:  The patient is discharged home in medically stable and  improved condition.  She is having no further abdominal pain, no nausea  or  vomiting and tolerating a regular diet without difficulty.  Hospital  follow-up will be with primary care physician, Dr. Loreen Freud, to  arrange in the next 3 weeks as needed.  Also, with other physicians, Dr.  Cyndia Bent of surgery, Dr. Vonita Moss of urology and Dr.  Jarold Motto  of GI on an as-needed or as-previously-scheduled basis.   HOSPITAL COURSE BY PROBLEM:  1. Recurrent partial small bowel obstruction versus ileus.  The      patient is a 77 year old woman with long history of recurrent      partial small bowel obstruction, always in the left lower quadrant,      near the site of her previous anastomosis with multiple      hospitalizations related to same, most recent in October of 2007,      who presents to the [facility] emergency room day of admission due      to left lower quadrant pain associated with nausea and vomiting of      less than 24 hours onset.  Because of the similarity of this to  her      other episode of obstruction, she came to the emergency room for      further evaluation.  KUB failed to show any abnormality, so a CT      abdomen and pelvis was performed which did in fact show small bowel      obstruction changes.  As such, general surgery was called, but      having known the patient with conservative resolution over the past      previous hospitalizations, medicine was instead asked to admit with      surgery consult.  Upon review of Dr. Tenna Child notes, it is felt      that the patient has recurrence of these symptoms which appear to      be obstructive in nature, but clinically behave as an ileus,      usually in the setting of UTI.  The patient did have an initial      leukocytosis of 22.6 with 95% neutropenia and toxic granulation,      but no other clear evidence of infection, as the UA appeared      negative; however, it was sent for culture and did return with E.      coli sensitive to fluoroquinolones, which were then begun.      However, even  prior to the beginning of antibiotics, her white      count spontaneously resolved to normal at 7.4, thus likely      representing the elevation due to demargination with the acute      stress of ileus due to infection.  The patient was kept n.p.o. for      24 hours.  Lipase was mildly abnormal at 100, but again there was      no evidence of pancreatitis on the CT, and the patient's symptoms      rapidly resolved, not behaving as pancreatitis.  A follow-up lipase      level was normal.  On the day of discharge, amylase was also mildly      abnormal at 169, and upon recheck remained mildly abnormal at 206.      The significance of this is unclear, as clinically she has      improved.  Surgery has followed and agreed that diet could be      advanced as tolerated and is stable for discharge home with      treatment of her UTI as noted.  A GI consultation was also obtained      for further advice and recommendations on management, and their's      were as listed above.  At this time, tolerating a regular diet and      symptomatically resolved.  Will discontinued home.  We have      recommended the discontinuation of Enablex, as the patient reports      she has been on this medication for about 2 weeks related to her      mixed stress and urge incontinence and has noticed a change in her      chronic diarrhea to becoming constipated for her, only having a      bowel movement once daily.  The patient feels strongly convinced      that more than a UTI and Enablex may have triggered this      exacerbation of her symptoms, as she had not had any symptoms such      as this in almost year.  As  such, I have instructed the patient      that she may discontinue her Enablex at this time until further      evaluation with her urologist, Dr. Vonita Moss, as previously      scheduled.      Valerie A. Felicity Coyer, MD  Electronically Signed     VAL/MEDQ  D:  08/26/2006  T:  08/26/2006  Job:  [id number]

## 2010-07-30 NOTE — Op Note (Signed)
NAME:  ROSENA, BARTLE               ACCOUNT NO.:  [id number]   MEDICAL RECORD NO.:  [id number]          PATIENT TYPE:  AMB   LOCATION:  [facility]                         FACILITY:  [facility]   PHYSICIAN:  Martina Sinner, MD DATE OF BIRTH:  07-May-1926   DATE OF PROCEDURE:  01/12/2007  DATE OF DISCHARGE:                               OPERATIVE REPORT   PREOPERATIVE DIAGNOSIS:  Muscle spasm, neurogenic bladder, urge  incontinence.   POSTOPERATIVE DIAGNOSIS:  Muscle spasm, neurogenic bladder, urge  incontinence.   PROCEDURE:  Cystoscopy, bladder hydrodistention, injection of Botox.   Ms. Ramone has refractory overactive bladder.  She has neurogenic bladder  risk factors.  She was prepped and draped in usual fashion.  I used the  ACMI scope for the examination.  She was hydrodistended to 700 mL.  Interestingly, she actually had glomerulations which is the supporting  finding of interstitial cystitis.  200 units of Botox were injected with  20 mL saline in 20 sites throughout the lower half of the bladder,  sparing the trigone.  There was little to no bleeding.  The bladder was  emptied.  The patient was taken to recovery room.  Hopefully this will  reach her treatment goal.           ______________________________  Martina Sinner, MD  Electronically Signed     SAM/MEDQ  D:  01/12/2007  T:  01/12/2007  Job:  [id number]

## 2010-07-30 NOTE — Consult Note (Signed)
[name], FAKE NO.:  [id number]   MEDICAL RECORD NO.:  [id number]          PATIENT TYPE:  EMS   LOCATION:  ED                           [facility]ACILITY:  [name]   PHYSICIAN:  [name], [name]      DATE OF BIRTH:  Apr 07, 1926   DATE OF CONSULTATION:  08/24/2006  DATE OF DISCHARGE:                                 CONSULTATION   REFERRING PHYSICIANS:  Dr. Shelda Miller, [name], emergency department  and [name]. [name], [name], [name].   CHIEF COMPLAINT:  Abdominal pain.   HISTORY OF PRESENT ILLNESS:  [name] is a 77 year old white female  well-known to my partner, [name], for previous abdominal  surgery.  The patient has a complex past surgical history including  history of diverticular disease with colonic resection, history of  appendectomy, history of small bowel obstruction with two prior  laparotomies for lysis of adhesions, gynecologic procedures.  The  patient developed lower abdominal pain on June 8.  This was associated  with onset of diarrhea.  Pain persisted and onset of nausea and vomiting  occurred early this morning.  The patient presented to the emergency  department for assessment.  Initial abdominal x-rays were unrevealing.  However CT scan abdomen and pelvis showed findings consistent with at  least partial small-bowel obstruction.  General surgery was called for  evaluation.   The patient has a history of similar episodes.  All of these have been  associated with urinary tract infections.  Her last admission was  January 13, 2006 for similar symptoms which resolved with bowel rest and  intravenous hydration.  The patient is admitted at this time to the  medical service.  Of concern, however, there is a marked leukocytosis  noted on admission laboratories.   PAST MEDICAL HISTORY:  Status post appendectomy, status post colonic  resection for diverticular disease with perforation, status post  subtotal  colectomy 1993 in [place], [name], status post  cholecystectomy, status post exploratory laparotomy and lysis of  adhesions x2 by [name], history of idiopathic  pancreatitis, history of recurrent urinary tract infection, history of  gastroesophageal reflux disease, history of restless leg syndrome,  history of osteoporosis.   MEDICATIONS:  1. Zyrtec.  2. Requip.  3. Actonel.  4. Celebrex.  5. Clonazepam.  6. Darvocet.  7. Flexeril.  8. Prilosec.  9. Synthroid.  10.Prozac.  11.Enablex.   ALLERGIES:  None known.   SOCIAL HISTORY:  The patient lives in [place].  She is widowed.  She  is accompanied by her daughter.  She denies tobacco use.  She drinks  alcohol occasionally.   FAMILY HISTORY:  Unremarkable.   REVIEW OF SYSTEMS:  A 15 system review documented in [name]  admission history and physical without significant other findings.   PHYSICAL EXAMINATION:  GENERAL:  A 77 year old pleasant white female, on  a stretcher in the emergency department.  No apparent distress.  VITAL SIGNS:  Temperature 97.1, pulse 85, respirations 16, blood  pressure 176/97, O2 saturation 99% room air.  HEENT:  Shows her to be normocephalic, atraumatic.  Sclerae clear.  Conjunctiva clear.  Pupils equal and reactive.  Dentition fair.  Mucous  membranes moist.  Voice normal.  Palpation of the neck shows no thyroid  mass.  No nodularity.  No lymphadenopathy.  No tenderness.  CHEST:  Clear to auscultation bilaterally without rales, rhonchi or  wheeze.  Cardiac exam shows regular rate and rhythm without murmur.  Peripheral pulses are full.  ABDOMEN:  Mildly distended.  There are a few bowel sounds on  auscultation.  All surgical wounds appear well-healed without sign of  herniation.  There is mild to moderate tenderness in the left mid and  lower abdomen.  There is no palpable mass.  There is no guarding.  There  is no rebound tenderness.  EXTREMITIES:  Nontender  without edema.  NEUROLOGICALLY:  The patient is alert and oriented without focal  deficit.   LABORATORY STUDIES:  White count 22.6, hemoglobin 14.2, hematocrit  43.4%, platelet count 205,000.  Differential shows 95% neutrophils, 3%  lymphocytes, 2% monocytes.  Electrolytes are normal.  Urinalysis shows  few bacteria and zero to two white cells by high powered field.   RADIOGRAPHY:  Abdominal x-ray with nonspecific bowel gas pattern.  Stapled anastomosis in the low midline.   CT scan abdomen and pelvis showing findings consistent with high-grade  to partial small-bowel obstruction likely in the left lower quadrant.   IMPRESSION:  Partial small-bowel obstruction.   PLAN:  1. The patient will be admitted on [name] Service by Dr.      Rene Miller.  2. N.p.o. status, intravenous hydration.  3. Repeat abdominal x-ray in a.m. June 10.  4. May need nasogastric decompression if continued emesis.  5. Will follow closely with you.      [name], [name]  Electronically Signed     TMG/MEDQ  D:  08/24/2006  T:  08/25/2006  Job:  [id number]   cc:   Janice Ports A. [name], [name]  [address] [name], [place] [address]

## 2010-08-02 NOTE — Discharge Summary (Signed)
[name], [name]               ACCOUNT NO.:  [id number]   MEDICAL RECORD NO.:  [id number]          PATIENT TYPE:  INP   LOCATION:  3309                         FACILITY:  [facility]   PHYSICIAN:  Leonie Man, M.D.   DATE OF BIRTH:  11/19/1926   DATE OF ADMISSION:  11/28/2004  DATE OF DISCHARGE:  12/01/2004                                 DISCHARGE SUMMARY   DISCHARGE DIAGNOSES:  1.  Partial small-bowel obstruction, resolved.  2.  Chest pain, resolved.  3.  Urinary tract infection, treated.   HOSPITAL COURSE:  [name] is a 77 year old female who was admitted on  November 28, 2004 with a recurrent small-bowel obstruction.  She was  admitted on November 28, 2004 for NG tube decompression and IV hydration.  Spontaneously, the small-bowel obstruction did resolve.  She did have a  urinary tract infection and this was treated with Cipro.  During her  hospital stay, she did have some chest pain and she was seen in consultation  by [facility], Dr. Juanito Doom, who felt the chest pain was noncardiac  in nature.  By hospital day #3, the patient was in stable condition and felt  to be ready to discharge to home.   DISCHARGE MEDICATIONS:  She was continue her current medications as prior to  admission which included Actonel, clonazepam, Zyrtec, Celebrex, Prozac,  Requip, Prilosec, estrogen cream, Flexeril, Darvocet, Synthroid, Phenergan  and Vicodin as needed.      Guy Franco, P.A.      Leonie Man, M.D.  Electronically Signed    LB/MEDQ  D:  02/03/2005  T:  02/04/2005  Job:  [id number]

## 2010-08-02 NOTE — H&P (Signed)
NAMEDORATHA, MCSWAIN               ACCOUNT NO.:  [id number]   MEDICAL RECORD NO.:  [id number]          PATIENT TYPE:  INP   LOCATION:  1621                         FACILITY:  [facility]   PHYSICIAN:  Angelia Mould. Derrell Lolling, M.D.DATE OF BIRTH:  [birth year]   DATE OF ADMISSION:  01/13/2006  DATE OF DISCHARGE:                                HISTORY & PHYSICAL   CHIEF COMPLAINT:  Abdominal pain and vomiting.   HISTORY OF PRESENT ILLNESS:  This is a 77 year old white female who has a  history of recurrent small bowel obstructions in the past and has had  multiple laparotomies in the past for benign disease. She has had at least  two colon resections in her life, a cholecystectomy, and at least two lysis  of adhesions by Dr. Jamey Ripa. She has had two or three admissions in the last  two years for partial small bowel obstructions each time which resolved  spontaneously.   She has chronic diarrhea since 1983 because of her subtotal colectomy.  Yesterday she developed abdominal pain and passed two solid hard bowel  movements. She has not had any bowel movement since yesterday and has not  passed any gas since about 5 p.m. yesterday. She has vomited four times  small amounts bilious in nature. She came to the emergency room. She was  evaluated by the emergency department physician. A CT scan was obtained  which suggests a partial small-bowel obstruction with transition zone in the  left lower quadrant, but there is no sign of any inflammatory process or  tumor. There is stool in the colon remnant. Apparently she has had a  flexible sigmoidoscopy within the last year or two which showed the  anastomosis to be open.   IMPRESSION:  She is being admitted for further evaluation and management.   PAST MEDICAL HISTORY:  1. Remote appendectomy.  2. Some type of colon resection at age 10 for some type of perforation.  3. Completion of subtotal colectomy in 1993 for diverticulitis by Dr. Mikey Bussing in  [facility]. 4. Cholecystectomy with Dr. Jamey Ripa.  4. She has had two lysis of adhesions by Dr. Jamey Ripa according to the      patient.  5. History of idiopathic pancreatitis.  6. History of recurrent urinary tract infections.  7. History of gastroesophageal reflux disease.  8. History of restless leg syndrome.  9. History of osteoarthritis and osteopenia.  10.History of low creatinine clearance.   CURRENT MEDICATIONS:  1. Requip.  2. Clonazepam.  3. Actonel 35 mg a day.  4. Zantac 150 mg daily.  5. Celebrex 200 mg twice daily.  6. Flexeril at bedtime.  7. Synthroid 25 mcg per day.   DRUG ALLERGIES:  None known.   SOCIAL HISTORY:  She lives in [place]. She is a widow. She has two  children. She denies use of tobacco. She drinks a glass of wine daily.   FAMILY HISTORY:  Positive for Crohn's disease and celiac sprue.   REVIEW OF SYSTEMS:  A 15-system Review of Systems is performed and is  noncontributory except  as described above.   PHYSICAL EXAMINATION:  GENERAL:  A pleasant older woman normal body habitus  to slightly thin. NG tube in place. She  is quite alert, friendly, and does  not appear to be in any distress at all.  VITAL SIGNS:  Temperature 96.9, pulse 76, respirations 18, blood pressure  196/100.  HEENT:  Eyes:  Sclerae clear. Extraocular movements intact. Ears, mouth,  throat, nose, tongue, and oropharynx are without gross lesions. She has had  lots of dental work done.  NECK:  Supple, nontender. I do not feel any mass. No jugular venous  distension.  LUNGS:  Clear to auscultation. No chest wall tenderness.  HEART:  Regular rate and rhythm. No murmurs. Radial and femoral pulses are  palpable.  ABDOMEN:  Soft and does not appear distended. There is a midline  scar which is well-healed. I do not feel a hernia or any masses. Bowel  sounds are normal to slightly hyperactive, but I do not hear any rushes. She  does have mild diffuse tenderness but no signs of any  peritoneal irritation.  GENITOURINARY:  I do not feel an inguinal hernia or inguinal adenopathy.  EXTREMITIES:  Moves all four extremities well without pain or deformity.  NEUROLOGIC:  No gross motor sensory deficits.   ADMISSION DATA:  The CT scan does show some diffusely dilated small bowel,  although this is not dramatic. The radiologist suggested transition point in  the left lower quadrant. There is no inflammatory process or free fluid. Lab  work reveals hemoglobin 14, white blood cell count 15,000. Complete  metabolic panel is normal except for glucose 161, BUN 24, lipase 31.  Prothrombin time 13, INR 1. Urinalysis reveals moderate leukocyte esterase  and 11-20 WBC.   ASSESSMENT:  1. Abdominal pain, nausea, and vomiting and possible partial small bowel      obstruction. There is no sign of any compromised bowel, and I do not      think she needs a laparotomy at this time.  2. History of recurrent urinary tract infections. Rule out existing      urinary tract infection.  3. Hypertension significant.  4. Status post multiple laparotomies.   PLAN:  1. The patient will be admitted for IV fluid hydration and nasogastric      suction.  2. We will reassess within 24 hours with repeat lab work exam and      abdominal x-rays.  3. We will place her on Lopressor for hypertension in the short term, and      I will ask the [facility] hospitalist service to evaluate her from a      comprehensive medical standpoint since they are her primary care      physicians.      Angelia Mould. Derrell Lolling, M.D.  Electronically Signed     HMI/MEDQ  D:  01/14/2006  T:  01/14/2006  Job:  [id number]   cc:   Loreen Freud, M.D.  [address] [place]  [place] [address]   Ulyess Mort, MD  [address]  [place]  [place] [address]   Currie Paris, M.D.  [address], [address]  [place]  [place] [address]

## 2010-08-02 NOTE — H&P (Signed)
Trainer. [facility]  Patient:    [name], [name]                     MRN: [id number] Adm. Date:  06/04/00 Attending:  Wilhemina Bonito. Eda Keys., M.D. [facility] Dictator:   Dianah Field, P.A.                         History and Physical  DATE OF BIRTH:  1926-08-05  CHIEF COMPLAINT:  Acute abdominal pain with nausea and vomiting.  HISTORY OF PRESENT ILLNESS:  [name] is a nice 77 year old white lady with a history of multiple abdominal surgeries, complicated diverticulitis and small bowel obstructions.  Two weeks ago she had an episode of abdominal pain with nausea and vomiting.  She went to see her primary physician.  No x-rays were performed and she does not think any blood was drawn either.  The doctor treated her with a "pain shot" at the office and gave her a prescription for Cipro and Flagyl.  By the time she got home in the evening, she was feeling better.  She slept the night through and was pain free the following day.  She never did fill the prescription for Cipro and Flagyl because the patient was fairly certain that what she had had was a small bowel obstruction and not diverticulitis.  Since then she has had loose stools four to six daily.  She has not seen any blood with these stools.  She had been eating normally and been pain free.  Yesterday suddenly at about 2:30 p.m. she developed nonfocal abdominal pain similar to what she had two weeks ago.  She took Darvocet N 100 but had no relief.  At about 5:00 she developed emesis. Initially emesis was partially digested food and later yellow bilious material.  Since yesterday morning, she has not had any stool. Pain is 8 to 10/10 in severity.  She came to the emergency room at [facility].  She got a 4 mg shot of IV morphine and felt better; however, she did require another shot later.  She has not been throwing up in the emergency room however.  Dr. Ignacia Palma worked her up initially.  Labs were  remarkable for a white count of 15.1 but she did not have any fevers.  Hemoglobin was 12.7.  There was a left shift present on differential.  Urinalysis was negative.  BUN was 23 and creatinine 0.8. Electrolytes were normal.  She was seen in the late morning by the P.A. for Dr. Marina Goodell and KUB was reviewed.  This showed no nondistended gas and fluid filled loops of small bowel in a nonspecific pattern with question of early small bowel obstruction.  Some mild interstitial opacities were incidentally seen on the chest x-ray.  The patient is not having any cough, shortness of breath, fevers or chills.  Orders for admission were initiated for this patient for treatment of probably an early small bowel obstruction.  Prior GI studies include a flexible sigmoidoscopy in April 2000 which study was normal, this study was done because of a history of diarrhea and cramps. She had a small bowel follow through as well in April 2000 which showed the appearance of a resected colon and one foot of remaining colon.  Transient time through the bowel was rapid.  She had two endoscopies in 1994.  The first showed a pyloric channel ulcer.  The second showed  the ulcer to be healed but she did have some mild gastritis and duodenitis.  PAST MEDICAL HISTORY:  History of diverticulitis.  At age 59 she had surgical repair of a perforated diverticulum along with appendectomy.  In 1993 she had a subtotal colectomy for complicated diverticulosis.  In 1995 she had two separate surgeries for lysis of adhesions which had caused small bowel obstruction.  The patient has had periodic hospitalizations for medical management of small bowel obstructions mostly at [facility].  The most recent was about 2-1/2 years ago.  Status post tubal ligation, a "Band-Aid surgery."  Status post open cholecystectomy.  Status post right cataract repair which required further surgery because of complications. Osteoarthritis.  Restless  leg syndrome.  Depression.  SOCIAL HISTORY:  The patient is widowed.  She is a retired Public librarian. She lives in [place].  Her son is a Clinical research associate in [employer].  She walks approximately three miles most days of the week.  She is not a smoker or a drinker.  FAMILY HISTORY:  She has a sister who has a history of Crohns disease and has required surgery either for the Crohns or for diverticulosis.  She has other sisters who have had diverticulosis/diverticulitis.  Three of her sisters have had breast cancer.  ALLERGIES:  No known drug allergies.  MEDICATIONS:  Prilosec 20 mg p.o. q.d.  Zyrtec 10 mg p.o. q.d.  Flexeril 10 mg p.o. at h.s.  Clonazepam 0.5 mg p.o. t.i.d.  Recoup 1 mg p.o. t.i.d.  Premarin 1.25 mg p.o. q.d.  Provera 2.5 mg p.o. q.d.  Darvocet N 100 p.r.n.  Celebrex 200 mg p.o. q.a.m.  Fosamax 70 mg one time weekly.  REVIEW OF SYSTEMS:  As above.  Additionally, neurologically she has no headaches, no focal weakness. She has no chest pain.  Cardiovascular:  No palpitations or edema.  GI: She denies heartburn or digestive symptoms. Otherwise the GI review is as above.  GU:  She has had no dysuria, frequency or hesitancy.  She gets yearly mammograms.  Pulmonary:  She has no cough but she does say that she does get short of breath when she walks her three mile walk.  PHYSICAL EXAMINATION:  GENERAL:  She is a pleasant, healthy looking white female who is comfortable.  VITAL SIGNS:  Blood pressure 115/57, pulse 84, respirations 20.  Room air saturations are 96%.  Temperature 97.9.  HEENT:  There is no scleral icterus.  No conjunctival pallor and extraocular movements are intact.  Oropharynx is notable for moist and clear mucosa.  NECK:  There is no bruits.  No JVD and no masses.  CHEST: Clear to auscultation and percussion bilaterally.  COR:  There is regular rate and rhythm with no murmurs, rubs or gallops.  ABDOMEN:  Soft, nondistended with minimal tenderness.   Bowel sounds are hypoactive but there is no tingling bowel sounds present. No hepatosplenomegaly or masses.   RECTAL:  There is liquid brown stool which is fecal occult blood negative.  No masses and rectal tone within normal limits.  EXTREMITIES:  No cyanosis, clubbing, or edema.  NEUROLOGIC:  There is no tremor and no focal weakness.  Alert and oriented times three.  GENITOURINARY/BREASTS:  Deferred.  IMPRESSION: 1. Abdominal pain with nausea and vomiting.  Probably early small obstruction.    Less likely would be symptoms represent recurrent diverticulitis. 2. History of small bowel obstruction for which she has had surgeries times    two and required admissions multiple  times for medical management. 3. Leukocytosis. Suspect this is reactive as the patient has no fevers. 4. Status post multiple abdominal and pelvic surgeries. 5. Restless leg syndrome. 6. History of depression. 7. History of diverticulitis. 8. History of pyloric channel ulcer in 1994, nonbleeding.  PLAN:  The patient will be admitted to regular bed.  Will continue IV fluids begun in the emergency room.  Will need to follow up the leukocytosis.  Will up CBC to reassess leukocytosis.  Provide morphine and Phenergan as needed for pain and management of nausea and pain.  Will follow up with repeat abdominal film in the morning to assess change in status as to the bowel obstruction. DD:  06/04/00 TD:  06/05/00 Job: [id number]

## 2010-08-02 NOTE — Discharge Summary (Signed)
NAME:  [name], [name]                         ACCOUNT NO.:  [id number]   MEDICAL RECORD NO.:  [id number]                   PATIENT TYPE:  INP   LOCATION:  5157                                 FACILITY:  [facility]   PHYSICIAN:  Jennye Moccasin, P.A.-C.              DATE OF BIRTH:  03-17-1927   DATE OF ADMISSION:  10/08/2001  DATE OF DISCHARGE:  10/11/2001                                 DISCHARGE SUMMARY   ADMISSION DIAGNOSES:  1. Recurrent nausea, vomiting, and diffuse abdominal pain in a patient with     a history of recurrent small bowel obstruction.  Rule out early small     bowel obstruction, rule out urinary tract infection.  2. History of small bowel obstructions requiring surgery for lysis of     adhesions twice in 1995.  3. History of diverticulosis and diverticulitis, status post repair of     perforated diverticulum at age 49.  Status post subtotal colectomy for     complicated diverticulosis in 1993.  4. Status post tubal ligation.  5. Status post open cholecystectomy.  6. Status post right cataract repair and repeat ocular surgery for     postoperative complications.  7. Osteoarthritis.  8. Restless leg syndrome.  9. Anxiety/depression.  10.      Gastroesophageal reflux disease.  11.      Osteopenia.   DISCHARGE DIAGNOSES:  1. Viral-appearing esophagitis, probably herpetic.  Treated empirically with     oral Acyclovir.  2. History of Klebsiella urinary tract infection.  Repeat urinalysis this     admission confirms ongoing urinary tract infection.  3. Idiopathic pancreatitis during recent admission.  4. History of small bowel obstruction, no evidence for recurrence this     admission.  5. Restless leg syndrome.  6. Recent pancreatitis of unclear etiology.  7. Diarrhea, C. difficile toxin negative.   CONSULTATIONS:  Marta Lamas. Lindie Spruce, M.D. from general surgery.   PROCEDURE:  Upper endoscopy by Dr. Hedwig Morton. Juanda Chance on October 09, 2001.  Findings included extensive  vesicular ulcers consistent with viral  esophagitis.  Biopsies were obtained of the esophagus.  In the body of the  stomach, there appeared to be an old PEG site (the patient denies prior  PEG).  Gastritis encountered.  CLO-testing performed.   BRIEF HISTORY:  The patient is a 77 year old white female who has an  extensive history of both complete and partial small bowel obstruction.  Previous to this admission of October 08, 2001, she had two admissions over the  last few weeks, one in early July and the second occurred July 17 through  the 24th with symptoms of nausea, vomiting, and abdominal pain and x-ray  findings consistent with partial small bowel obstruction.  During the second  admission, she also was diagnosed with pancreatitis with unclear etiology.  Because of nausea and vomiting, only ultrasound imaging had been performed  to assess the biliary tract.  It showed some slight prominence in the common  bile duct which was probably consistent with that of a postcholecystectomy  patient.   After her second discharge, plan was for her to have a CT scan the following  week.  She also was diagnosed with a Klebsiella UTI during that second  admission and was sent home on oral ciprofloxacin.  At the time of that  second discharge, the patient was tolerating solid diet and had resolved her  abdominal pain.  She was having loose stools.   The patient was only home for maybe half a day when she had recurrence of  abdominal pain followed by nausea and vomiting.  She was also having  multiple loose stools.  Symptoms became refractory to home use of Phenergan.  She was unable to keep her regular medication regimen down.  She was advised  to come to the emergency room for further evaluation.  Obviously concern was  that she could have recurrent pancreatitis or small bowel obstruction.   LABORATORY DATA:  White blood cell count was initially 14.6, rechecked at  10.6.  Hemoglobin 11.2,  hematocrit 34.3, MCV 95.1, platelets 351,000.  Differential revealed left shift with elevation in both percentages and  absolute count of neutrophils and diminished numbers of monocytes and  lymphocytes.  Sodium 139, potassium 4.4, glucose 102, BUN 8, creatinine 0.9,  chloride 109, CO2 19, total protein 6.9, albumin 3.3.  Total bilirubin 0.6,  alkaline phosphate 106.  AST 22, ALT 29.  Amylase 124, lipase 45.  Urinalysis showed a large amount of leukocyte esterase, rare epithelial  cells, 11 to 20 white blood cell counts and a few bacteria.  C. difficile  toxin testing was negative.  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  Helicobacter testing/CLO-test was negative.   Imaging studies:  A 2-view abdomen showed nonspecific bowel gas pattern and  a few air fluid levels in loops of colon which were not specific and  definitely not obstructive.   HOSPITAL COURSE:  1 - HISTORY OF SMALL BOWEL OBSTRUCTION:  Films obtained  when the patient came into the hospital day one and again on day two did not  confirm small bowel obstruction.  There was some concern on day one that the  films may not be accurately representing possible small bowel obstruction.  In the past, initial abdominal films have been soft calls regarding small  bowel obstructions.  Dr. Marta Lamas. Lindie Spruce did not feel that any further  surgical evaluation was warranted.  She had a small bowel series on September 20, 2001 that showed rapid emptying into the colon, some mild narrowing in the  right lower quadrant in the ileal region was noted.  This was in an area  with visible surgical staples and was felt to represent some narrowing but  not obstruction associated with postsurgical adhesions.  2 - HERPETIC ESOPHAGITIS:  Dr. Hedwig Morton. Juanda Chance performed the upper endoscopy  with findings noted above.  Biopsies did confirm herpetic involvement  causing the grade 3 esophagitis.  Initially, she was treated with Acyclovir. At  discharge, she went home on Valtrex as a b.i.d. dosing was preferable to  a five time a day dosing of Zovirax.  3 - HISTORY OF KLEBSIELLA URINARY TRACT INFECTION:  A urinalysis obtained  again during this hospital admission confirmed an active urinary tract  infection.  Cultures were not obtained but she was to complete a 10-day  course of ciprofloxacin following discharge.  Because  of problems with  recurrent UTIs, the patient was advised to have a follow-up urinalysis a  couple of weeks after completing her course of ciprofloxacin in order to  assure that she had complete resolution of her urinary tract infection.  4 - RESTLESS LEG SYNDROME:  This is a fairly serious problem for this  patient.  When the problem is active, she has clonic-type activity of both  the upper and lower extremities.  She is on what appeared to be fairly high  doses of Requip four times daily to control this.  This is actually an anti-  parkinsonian drug.  However, it seems to work.  Unfortunately, when she has  to come off of this because of nausea and vomiting, her symptoms rapidly  recur and are not particularly amenable to treatment with benzodiazepines.  5 - RECENT HISTORY OF PANCREATITIS:  The etiology of this is unclear.  The  patient did not have recurrent pancreatitis this admission.  Her outpatient  CT scan for further evaluation of the biliary tree and pancrease were  rescheduled.   DISCHARGE MEDICATIONS:  1. Ciprofloxacin 500 mg 1 p.o. b.i.d. for 10 days.  2. Klonopin 0.5 mg p.o. h.s.  3. Requip 1 mg p.o. q.i.d.  4. Phenergan 25 mg per rectum or orally p.r.n. for nausea.  5. Darvocet-N 100 with 1-2 p.o. q.6h. p.r.n. pain.  6. Fosamax 75 mg p.o. q.week.  For the time being, she was not to take this     medication given the severity of this esophagitis and was to restart this     only when it was okay with Dr. Ulyess Mort to do so.  7. Celebrex 1 mg p.o. q.d.  She is to restart this in two  weeks.  8. Zyrtec 10 mg p.o. q.d.  9. Prilosec 20 mg 1 p.o. q.d.  10.      Valtrex 1000 mg p.o. b.i.d. for five days.   DIET:  As tolerated but to eat a soft, low acid foods if she had any  odynophagia associated with her esophagitis.   FOLLOW UP:  Follow-up visits with Dr. Ulyess Mort on October 27, 2001  at 11:15.  The patient has an appointment for CT scan scheduled at [facility] on  October 13, 2001 at 9:15 and she was to go by that office before that date to  pick up instructions and the oral contrast.  The patient was to proceed to  her primary doctor's office in [facility] in a couple of weeks for repeat  urinalysis and culture to make sure that she had a complete resolution of  her UTI.  The patient is trying to switch her primary care to the [place]  office at [facility].  Currently, her primary care Bethanie Bloxom at [facility] on  [address] in [facility] and that physician's name is Dr. Cephus Richer.                                                  Jennye Moccasin, P.A.-C.   SG/MEDQ  D:  10/22/2001  T:  10/27/2001  Job:  [id number]   cc:   Marta Lamas. Gae Bon, M.D.  Fax: [phone]   Ulyess Mort, M.D. [facility]   Cephus Richer, M.D.  [facility]

## 2010-08-02 NOTE — H&P (Signed)
NAMEBROOKLEN, RUNQUIST               ACCOUNT NO.:  [id number]   MEDICAL RECORD NO.:  [id number]          PATIENT TYPE:  INP   LOCATION:  5731                         FACILITY:  [facility]   PHYSICIAN:  Malcolm T. Russella Dar, M.D. [facility] OF BIRTH:  05/25/1926   DATE OF ADMISSION:  02/14/2004  DATE OF DISCHARGE:                                HISTORY & PHYSICAL   CHIEF COMPLAINT:  Nausea, vomiting and abdominal pain for 8 hours.   HISTORY OF PRESENT ILLNESS:  Mrs. Albarracin is a pleasant 77 year old white  female who has a long history of recurrent partial small-bowel obstructions  requiring exploratory laparotomies and lysis of adhesions in the past, and,  in the last several years, repeated admission for medical management of  partial small-bowel obstructions.  The patient developed worsening abdominal  pain with recurrent nausea and vomiting.  She was not having any flatus or  bowel movements.  She knows her body well at this point and felt that she  was not going to be able to ride this one out at home, and presented to the  emergency room.  She was evaluated by Dr. Judie Petit T. Russella Dar and the acute  abdominal series did show an ileus versus partial small-bowel obstruction  pattern.  Her UA was negative and it is noted that in the past, she has  frequently had a urinary tract infection to accompany her partial small-  bowel obstructions.   ALLERGIES:  Allergies to DEMEROL which causes mental status changes.   CURRENT MEDICATIONS:  1.  Actonel 35 mg weekly.  2.  Clonazepam 0.5 mg at h.s.  3.  Zyrtec 10 mg daily.  4.  Cyclobenzaprine 10 mg at h.s.  5.  Propoxyphene 100 mg 4 times daily.  6.  Celebrex 20 mg daily.  7.  Prozac 40 mg daily.  8.  Requip 1 mg daily.  9.  Prilosec OTC daily.  10. Trimethoprim 100 mg at h.s.  11. Estrace 0.01% cream applied vaginally 3 times weekly.  12. Levsin SL as needed.   PAST MEDICAL HISTORY:  1.  Multiple partial small-bowel obstructions.  2.  Status post  subtotal colectomy in 1993 because of complicated      diverticulosis; age 54, underwent exploratory laparotomy with repair of      a perforated diverticulum.  3.  Status post exploratory laparotomy with lysis of adhesions in the past,      most recently in 1995.  4.  Status post open cholecystectomy.  5.  Status post cataract extractions.  6.  Osteoarthritis.  7.  Osteopenia.  8.  Restless leg syndrome.  9.  Gastroesophageal reflux disease.  Latest endoscopy, October 09, 2001,      showed grade 3 esophagitis, nonspecific gastritis and an old      percutaneous gastrostomy scar.  10. Depression and anxiety.  11. Recurrent urinary tract infections.  Note that she is on suppressive      nightly therapy with trimethoprim.  12. Idiopathic pancreatitis in 2003.  13. Status post bilateral tubal ligation.  14. Allergic rhinitis.   SOCIAL HISTORY:  The patient  is a retired widow.  She lives in [place].  Her family contact is her son, Anastazia Creek, and his phone number is [phone].  She also has a daughter, Hollice Gong; her phone number is [phone];  she lives in [place].  Mrs. Kingsford does not smoke or drink alcohol.   FAMILY HISTORY:  She has a daughter who has celiac disease.  She has  multiple sisters, one of whom has Crohn's disease and has required surgery  in the past.  Other sisters have had diverticulosis and diverticulitis, and  have developed partial small-bowel obstructions.  Not clear whether or not  they have had to have surgery or not.   REVIEW OF SYSTEMS:  GI:  As above.  GU:  Does have difficulty with urinary  incontinence.  CARDIOVASCULAR:  No extremity edema, no chest pain, no  palpitations.  PULMONARY:  No shortness of breath, no cough, no pleuritic  pain.  NEUROLOGICAL:  Does have restless leg syndrome which is pretty well  managed with current medical regimen.  HEMATOLOGY:  Does have a history of  anemia.  MUSCULOSKELETAL:  Does have osteopenia and uses the weekly  Actonel.  Has a weak right ankle which gives way at times.  She has not had any  falls as a result.  ENT:  She has been requiring sunglasses because her eyes  are very sensitive since she had a cataract removed.  She does have partial  upper denture.  DERMATOLOGIC:  No history of skin cancer.  No rashes.  No  itching.  HEMATOLOGIC:  No unusual bruising or bleeding.   PHYSICAL EXAM:  GENERAL:  The patient is a pleasant, petite white female who  is uncomfortable-appearing.  She is a good historian, a little bit anxious.  VITAL SIGNS:  Blood pressure 169/76, temperature is 97.1, pulse 84,  respirations 24 and room air saturations 98%.  HEENT:  Sclerae nonicteric.  Conjunctivae are pink.  Extraocular movements  intact.  Oropharynx:  Mucous membranes are moist and clear.  Dental repair  is excellent.  NECK:  No thyromegaly, no masses, no JVD, no bruits.  CHEST:  Chest is clear to auscultation and percussion bilaterally.  COR:  There is a regular rate and rhythm.  No murmurs, rubs, or gallops  appreciated.  ABDOMEN:  Abdomen is soft, mildly distended.  Bowel sounds are hyperactive.  She has a diffuse mild tenderness, no rebound or guarding.  No  hepatosplenomegaly or bruits appreciated.  No hernias.  Scars are all well-  healed.  RECTAL EXAM, GU EXAM AND BREAST EXAMS:  Deferred.  NEUROLOGIC:  She is alert and oriented x3.  No tremor.  Upper extremity and  lower extremity strength is 5/5.  PSYCHIATRIC:  The patient is in good spirits.  She is appropriate.  DERMATOLOGIC:  No rashes.  HEMATOLOGIC:  No significant purpura or bruising.   LABORATORIES:  Sodium 138, potassium 4.8.  BUN is 31, creatinine is 1.6.  Hemoglobin is 13, hematocrit is 38.3, white blood cell count is 15.6.  MCV  is 97.5.  Platelets of 226,000.  Urinalysis negative for glucose, negative  for protein.  She is nitrite-positive and leukocyte-esterase-negative. There are many bacteria, rare epithelial cells, 0-2 white blood  cells and 0-  2 red blood cells.   Acute abdominal series showing ileus versus partial small-bowel obstructive  pattern as evidenced by some mildly dilated loops of small bowel.   IMPRESSION:  1.  Partial small-bowel obstruction, recurrent.  2.  Bacteruria, need  to rule out urinary tract infection.  3.  Status post multiple abdominal surgeries for complicated diverticular      disease as well as small-bowel obstructions, the latest of which was in      1995.  4.  Renal insufficiency, probably some dehydration going on here.  5.  Mild hyperglycemia.  The patient does not have any history of adult-      onset diabetes.  6.  History of idiopathic pancreatitis.  We will need to check a lipase to      rule out possible recurrent pancreatitis.   PLAN:  Patient admitted to Dr. Judie Petit T. Stark's service to a regular non-  telemetry bed.  Plan to place NG tube to low intermittent suction, provide  antiemetics and IV PPIs.  Check amylase and lipase in the morning.  Followup  KUB in the morning.       SG/MEDQ  D:  02/15/2004  T:  02/15/2004  Job:  [id number]

## 2010-08-02 NOTE — Procedures (Signed)
[facility]  Patient:    [name], [name] Visit Number: [id number] MRN: [id number]          Service Type: MED Location: [phone] Attending Physician:  Mervin Hack Proc. Date: 10/09/01 Admit Date:  10/08/2001                             Procedure Report  PROCEDURE:  Upper endoscopy.  SURGEON:  Hedwig Morton. Juanda Chance, M.D.  INDICATION FOR PROCEDURE:  This 77 year old white female was admitted with abdominal pain and vomiting, odynophagia of several weeks duration. She was discharged the day prior to her current admission after hospitalization for what was felt to be a pancreatitis.  Prior to that, just several weeks before that, she was admitted for partial small-bowel obstruction. She has a history of chronic abdominal pain, multiple abdominal surgeries and adhesions.  This time, her pain was more consistent with epigastric and left upper quadrant abdominal pain either suggestive of pancreatitis or peptic ulcer disease which she had several years ago.  Her initial KUB was normal.  She is undergoing upper endoscopy to further assess her odynophagia as well as epigastric pain.  ENDOSCOPE: Fujinon single channel video endoscope.  SEDATION:  Versed 5 mg IV, Fentanyl 50 mcg IV.  FINDINGS:  Fujinon single channel video endoscope passed under direct vision to the posterior pharynx into the esophagus.  The patient was monitored by pulse oximeter.  Oxygen saturations were normal. She was very cooperative. There were vesicular lesions on the the patients lips consistent with herpes. Oral cavity was normal.  Valleculae and piriform sinus were unremarkable. Proximal, mid and distal esophageal mucosa was covered with serpiginous ulcerations with some vesicular and friable mucosa, some covered with exudates, some denuded.  Multiple biopsies were taken for viral cultures, specifically herpes and CMV.  Also, routine biopsies were taken for  H&E stains. There was no stricture and nothing to suggest Candida esophagitis. Squamocolumnar junction was located at 38 cm from the incisors and appeared normal.  Stomach:  The stomach was insufflated with air and showed increased erythema with streaks of intense hyperemia in the gastric antrum. There was a compressed scar at the dependent portion of the stomach at the junction and the antrum and the body of the stomach which was consistent with post percutaneous gastrostomy site. There were no ulcerations.  Retroflexion of the endoscope revealed normal fundus and cardia.  Duodenum: The duodenal bulb and descending duodenum were unremarkable.  IMPRESSION: 1. Grade III esophagitis, status post biopsies and viral cultures consistent    with herpetic esophagitis. 2. Nonspecific gastritis, status post CLOtest. 3. Old percutaneous gastrostomy scar.  PLAN:  The patients symptoms may be related to herpetic esophagitis. She will therefore be treated while we are awaiting results of the viral cultures and CLOtest. She will continue on bowel rest for now, IV fluids and Zovirax 200 mg five times a day. If the vomiting continues, suggest small-bowel follow-through by enteroclysis. Attending Physician:  Mervin Hack DD:  10/09/01 TD:  10/12/01 Job: [id number]

## 2010-08-02 NOTE — Discharge Summary (Signed)
NAME:  [name], [name]               ACCOUNT NO.:  [id number]   MEDICAL RECORD NO.:  [id number]          PATIENT TYPE:  INP   LOCATION:  6714                         FACILITY:  [facility]   PHYSICIAN:  Revonda Standard L. Rennis Harding, N.P. DATE OF BIRTH:  [birth year]   DATE OF ADMISSION:  03/24/2005  DATE OF DISCHARGE:  03/25/2005                                 DISCHARGE SUMMARY   ADMITTING PHYSICIAN:  Dr. Talmage Nap.   CHIEF COMPLAINT AND REASON FOR ADMISSION:  Abdominal pain, nausea, and  emesis for two days.   HISTORY OF PRESENT ILLNESS:  Ms. Hunn is a 77 year old female with a  history of multiple abdominal surgeries over the past 46 years.  Several  operations for small bowel obstruction.  She has been seen in the ER several  times for small bowel obstruction in 2006 usually to spontaneous resolution.  In the past, these have been associated with UTI.  Important to note, that  patient has a total abdominal colectomy history with ileorectal anastomosis,  and has had prior lysis of adhesions x2.  In the ER, her exam was  unremarkable.  Abdomen was nontender.  She was having flatus in the ER.  Her  white count was elevated at 20,700.  Hemoglobin 14.  CO2 on a BMET was 17.  BUN 21 and creatinine 1.2.  Because of these findings, the patient was  admitted for IV fluids and observation for the following diagnoses:  1.  Partial small bowel obstruction.  2.  Dehydration.  3.  Leukocytosis.   HOSPITAL COURSE:  The patient was admitted to the regular floor.  She was  started on IV fluids for hydration.  Clear liquid diet while in the  emergency room waiting for her bed assignment.  The patient had a large  bowel movement and felt much better.  Because of lack of IV access, a PICC  line was inserted to continue IV fluids.  Her fluids were run at 125 an  hour.  An x-ray was done in the ER that again showed this partial small  bowel obstruction with ileus, again due to feces.   By the morning of discharge, the  patient was feeling weak and had a  headache, but was having no further abdominal pain, nausea or vomiting, and  was tolerating a clear liquid diet.  Her white count had gone down to 8,400.  Hemoglobin was 11.8.  CO2 on the BMET had returned to a normal level of 20.  BUN 22 and creatinine 1.3.  Her urinalysis was negative except for a greater  than 300 on protein.  On the morning of discharge, her diet was advanced to  low residue and plans were to discharge her later in the day if she  tolerated the diet.  IV fluids were decreased to 50 an hour with plans to  discontinue the PICC line at discharge.  In discussing with the patient, she  reports that she is supposed to be adhering to a low residue diet due to her  history of diverticular disease.  She has not been doing this, and actually  ate a large fruit plate several days before coming to the hospital, and  review of patient's medications, she is also on multiple medications that  may effect her bowel pattern including Clonazepam, Requip, Flexeril,  Actonel, and Darvocet.  Because of this, we will attempt to try a very low  dose of bulking fiber in the form of a quarter pack of Metamucil with  patient instructed to stop this medication if she has increased diarrhea  noting that with patient's prior surgeries, her baseline fecal pattern is of  looser to more watery stools.   FINAL DIAGNOSES:  1.  Partial small bowel obstruction secondary to feces, resolved.  2.  Dehydration, resolved.  3.  Leukocytosis, resolved.  4.  History of perforated diverticulitis in the past.  5.  Appendectomy.  6.  Right hemicolectomy with subsequent total abdominal colectomy and      ileorectal anastomosis.  7.  Prior lysis of adhesions.  8.  Prior cholecystectomy.  9.  History of hypothyroidism.   DISCHARGE MEDICATIONS:  They are the same as prior to admission except for  the addition of the Metamucil.  1.  Requip 1 mg t.i.d.  2.  Requip 2 mg at  bedtime.  3.  Clonazepam 1.5 mg bedtime.  4.  Actonel 35 mg daily.  5.  Zyrtec 10 mg daily.  6.  Celebrex 200 mg b.i.d.  7.  Prilosec 20 mg daily.  8.  Flexeril 10 mg at bedtime.  9.  Darvocet one tab four times daily p.r.n.  10. Synthroid 25 mcg daily.  11. SMV/TMP 1/2 tablet at bedtime.  12. A quarter packet of Metamucil at bedtime, stop if diarrhea.   DIET:  Return to a low residue diet as before.   ACTIVITY:  No restrictions.  No wound care.   FOLLOW UP APPOINTMENTS:  She is to see her family M.D. in two weeks.  She  needs to call to set an appointment.      Allison L. Rennis Harding, N.P.     ALE/MEDQ  D:  03/25/2005  T:  03/25/2005  Job:  [id number]

## 2010-08-02 NOTE — Discharge Summary (Signed)
[name], [name]               ACCOUNT NO.:  [id number]   MEDICAL RECORD NO.:  [id number]          PATIENT TYPE:  INP   LOCATION:  5736                         FACILITY:  [facility]   PHYSICIAN:  Wilhemina Bonito. Marina Goodell, M.D. [facility] OF BIRTH:  Aug 10, 1926   DATE OF ADMISSION:  02/14/2004  DATE OF DISCHARGE:  02/19/2004                                 DISCHARGE SUMMARY   ADMISSION DIAGNOSES:  1.  Nausea, vomiting and abdominal pain with x-ray evidence for partial      small bowel obstruction.  2.  Bacteruria, rule out urinary tract infection.  3.  History of multiple partial small bowel obstructions and complete bowel      obstructions.  Has required surgery in the past.  4.  Status post subtotal colectomy in 1993 secondary to complicated      diverticulosis.  5.  Status post exploratory laparotomy at age 5 for repair of perforated      diverticulum.  6.  Status post exploratory laparotomy and lysis of adhesions on several      occasions in the past, most recently in 1995.  Many of her surgeries      have been in [facility], but her [facility] is Dr. Jamey Ripa.  7.  Status post open cholecystectomy.  8.  Status post cataract extraction.  9.  Osteoarthritis and osteopenia with chronic back pain managed with      Darvocet.  10. Restless leg syndrome.  11. Gastroesophageal reflux disease.  Latest endoscopy October 09, 2001,      showing grade 3 esophagitis, nonspecific gastritis and old percutaneous      gastrostomy scar.  12. Depression/anxiety, currently well managed.  13. Recurrent urinary tract infections.  She is currently on nightly      suppressive therapy with trimethoprim.  Also note, that often when she      presents with partial small bowel obstruction in the recent past, these      have been accompanied by urinary tract infections.  14. History of idiopathic pancreatitis in 2003.  15. Status post bilateral tubal ligation.  16. Allergic rhinitis.  17. Mild hyperglycemia with  blood glucoses in the 120's.  18. Mental status changes secondary to Demerol.   DISCHARGE DIAGNOSES:  1.  Recurrent partial small bowel obstruction, resolved with conservative      medical management.  2.  E. coli urinary tract infection which is resistant to Septra.  Day #2      Macrodantin at the time of discharge.  3.  Hyponatremia, mild.  4.  Dilated common bile duct in a patient whose gallbladder is surgically      absent.  AST and ALT in the low 100's.  Question whether the common duct      dilatation is physiologic.  AST and ALT may be elevated secondary to the      bowel obstruction.  5.  Slight increase in lipase to 106 with an Amylase of 244.  These      abnormalities resolved on subsequent assays.  Question whether these      were reactive to  the bowel obstruction or represent a recurrent case of      idiopathic pancreatitis.  Pancreas is within normal limits visually on      CT.  6.  Restless leg syndrome.  7.  Chronic low back pain.  8  Mild hyperglycemia.  1.  Normocytic anemia.  2.  Renal insufficiency, resolved with hydration, probably secondary to      prerenal azotemia.  Did not pursue creatinine clearance testing.   PROCEDURES:  None.   CONSULTATIONS:  None.   BRIEF HISTORY:  [name] is a nice 77 year old white female who is well  known to both internal medicine and  Internal Medicine and GI because  of recurrent partial small bowel obstructions requiring admissions many  times over the last several years.  She has not required surgery since 1995  but has had to have lysis of adhesions in the past.  The patient  occasionally has bouts with abdominal pain and nausea and vomiting which she  often rides out at home.  However, she developed severe abdominal pain and  several episodes of nausea and nonbloody vomiting and she presented to the  emergency room.  She had not had any bowel movements or flatus in the last  36 hours prior to presenting.  In the  emergency room, acute abdominal series  showed a pattern consistent with illus versus small bowel obstruction and  Claudette Head admitted her for medical management.  Her urinalysis did show  a lot of bacteria.  Her lipase was elevated, and LFT's were also elevated.  The specifics can be seen below.   LABORATORY DATA:  Urine culture grew out greater than 10/5 E. coli sensitive  to multiple drugs including Nitrofurantoin, but resistant to trimethoprim  sulfamethoxazole.  Initial white blood cell count was 15.6.  It resolved to  9.7.  Hemoglobin 13 initially, 10.3 at discharge.  Hematocrit was 38.3.  At  discharge, it was 29.9.  Platelets ranging anywhere from 172 to 226,000.  Differential on white blood cell count did show a left shift.  Sodium  initially 138, following hydration it was 130.  Potassium was 4.0.  Glucose  range of 114 up to 128.  BUN initially 31, at discharge, 4.  Creatinine  initially 1.6, at discharge, 1.  Calcium 7.7.  Total protein 5.7, albumin  2.9.  AST went from 124-52.  ALT went from 102-56.  Alkaline phosphatase was  75.  Total bilirubin went from 1.7-1.3.  Amylase initially to 44, 98 at  discharge.  Lipase initially 106, corrected to 52.  Urinalysis showed 0-2  white blood cells, 0-2 red blood cells, many bacteria, positive nitrates and  no leukocyte esterase.  It did grow out the E. coli.   RADIOLOGY:  Initial acute abdominal series showed no acute process in the  chest.  There was some air fluid levels in the small bowel that were  elevated compared with studies done in July of 2005.  I am not clear whether  this represented a partial small bowel obstruction or focal illus.  A CT  scan on February 15, 2004, showed a small bowel obstruction.  Transition  point looked to be in the left lower abdomen.  No free fluid or  pneumoperitoneum.  The common bile duct which had previously been dilated now measured 15 mm and extended from the intrahepatic region into the   ampulla.  No masses seen on the pancreas, liver, spleen, adrenals and liver  unremarkable.  There were  cysts in the kidneys bilaterally, and gallbladder  surgically absent.   HOSPITAL COURSE:  The patient was admitted and an NG tube was placed.  Her  oral medications were held, and she was provided with morphine and Phenergan  as needed and started on IV D.5 1/2 normal saline with added potassium.  The  NG tube by hospital day #3 continued to put out significant amounts of  volume, 1000 cc were recorded between December 1 and December 2.  So, this  was continued.  Pain was a little better controlled.   The LFT elevation was noted along with the lipase elevation and Dr. Leone Payor  ordered a CT scan which showed the common duct dilatation.  However, in  2003, the duct was 12 mm.  There has never been any evidence of pancreatic  abnormality on prior CT's.  She has not had MRCP or ERCP as far as we can  tell.  She has not had any obvious symptoms referable to biliary  obstruction.  Perhaps in the future, it may be wise to get an MRCP, but  during this hospitalization, this was not pursued.  By hospital day #4, her  NG tube output had decreased to 150 cc during the previous 24 hours, and the  NG tube was clamped.  The following day, it was discontinued.  During that  time period, she managed to have a bowel movement and tolerated clear  liquids.   The patient was complaining of dysuria and hesitancy, and indeed what was  initially bacteruria on UTI did turn out to be an E. coli UTI.  Significantly, the organism was resistant to Septra.  This is important  because she has been on suppressive therapy with trimethoprim every night  per her urologist Dr. Etta Grandchild.  We chose to treat her with a 7 day course of  Macrodantin.  Fortunately, she has a follow up appointment with Dr. Etta Grandchild on  December 13, at which point different suppressive therapy can be addressed.  In the meantime, she was advised not  to start back on the Trimethoprim.   The patient's course, once the NG tube was pulled, was one of continued  stable improvement.  She had no recurrent abdominal pain, had another bowel  movement.  She did not have a repeat KUB to determine whether or not the  small bowel obstruction was radiologically cleared because she was  clinically doing so well.  She was discharged to home in stable condition on  all of her previous medications with the addition of the Macrodantin.   DISCHARGE MEDICATIONS:  1.  Actonel 35 mg weekly.  2.  Clonazepam 0.5 mg q.h.s.  3.  Zyrtec 10 mg daily.  4.  Cyclobenzaprine 10 mg/Darvocet 100 mg one p.o. q.i.d.  5.  Celebrex 20 mg daily.  6.  Prozac 40 mg daily.  7.  Requip 1 mg x5 daily.  Note that the Requip is 1 mg x5 daily not 1 mg      daily as was stated on the History and Physical at admission.  8.  Prilosec OTC 20 mg daily.  9.  Estrace 0.01% vaginal cream x3 weekly. 10. Levsin SL as needed.   DISCHARGE INSTRUCTIONS:  Diet:  She was to slowly add fiber back into her  diet.  She is advised to avoid concentrated sugars and sweets.   FOLLOWUP:  Appointments with Dr. Etta Grandchild for December 13, and with Dr. Victorino Dike Monday, December 19, at 2:30 p.m.  SG/MEDQ  D:  02/19/2004  T:  02/20/2004  Job:  [id number]   cc:   Ulyess Mort, M.D. [facility]   Angelena Sole, M.D. [facility]   Claudette Laws, M.D.  [address]  [place]  [place] [address]  Fax: [phone]

## 2010-08-02 NOTE — H&P (Signed)
NAMECHERALYN, OLIVER               ACCOUNT NO.:  [id number]   MEDICAL RECORD NO.:  [id number]          PATIENT TYPE:  INP   LOCATION:  3008                         FACILITY:  [facility]   PHYSICIAN:  Ollen Gross. Vernell Morgans, M.D. DATE OF BIRTH:  1927-01-30   DATE OF ADMISSION:  05/05/2004  DATE OF DISCHARGE:                                HISTORY & PHYSICAL   Ms. [name] is a 77 year old white female who presents today to the emergency  department with abdominal pain that started last Tuesday.  The pain was  associated with some nausea and vomiting.  She has also had for the last  week diarrhea.  She has had diarrhea and flatus today.  She denies any  fevers.  Her only complaint is some lower abdominal pain,  She feels better  now since coming to the emergency department.   PAST MEDICAL HISTORY:  1.  Partial small-bowel obstruction.  2.  Urinary tract infections.  3.  Diverticulosis and diverticulitis.  4.  Cataracts.  5.  Osteoarthritis.  6.  Osteopenia.  7.  Restless leg syndrome.  8.  Gastroesophageal reflux disease.  9.  Depression and anxiety.  10. Pancreatitis.  11. Rhinitis.   PAST SURGICAL HISTORY:  1.  Multiple abdominal surgeries for bowel obstructions and perforated      diverticulitis.  2.  Cataract surgery,   MEDICATIONS:  Include Actonel, clonazepam, Zyrtec, cyclobenzaprine,  Celebrex, Prozac, Prilosec, Esterase, and Levsin.   ALLERGIES:  DEMEROL.   SOCIAL HISTORY:  She denies history of tobacco or tobacco products.   FAMILY HISTORY:  Noncontributory.   PHYSICAL EXAMINATION:  VITAL SIGNS:  She is afebrile with stable vital  signs.  GENERAL:  She is an elderly white female in no acute distress.  SKIN:  Warm and dry with no jaundice.  EYES:  Extraocular muscles are intact.  Pupils equal, round, and reactive to  light.  Sclerae nonicteric.  LUNGS:  Clear bilaterally with no use of accessory respiratory muscles.  HEART:  Regular rate and rhythm with impulse in the left  chest.  ABDOMEN: Soft with minimal lower abdominal tenderness.  No guarding or  peritoneal signs.  No palpable mass or hepatosplenomegaly.  She has minimal  abdominal distention with normal bowel sounds.  EXTREMITIES:  No cyanosis, clubbing, or edema.  She does have a little bit  of trash foot on the left second toe.  No signs of infection.  PSYCHOLOGIC:        Alert and oriented x 3 with no anxiety or depression.   LABORATORY DATA AND OTHER STUDIES:  On review of her x-rays, she was noted  to have a couple of small air/fluid levels consistent with partial small-  bowel obstruction versus an ileus.   On review of other lab work, her UA was positive for many bacteria.   ASSESSMENT AND PLAN:  This is a 77 year old white female with abdominal pain  since last Tuesday associated with nausea, vomiting, diarrhea.  It is  certainly possible that she has some partial small-bowel obstruction given  her history of many abdominal surgeries.  She  apparently has had similar  picture to this in the past which has accompanied a urinary tract infection,  and when the urinary tract infection is treated, she improves which would be  indicative of an ileus which is also possible.   We will plan to admit her for IV hydration and bowel rest. We will treat her  urinary tract infection and repeat her abdominal films in the morning and  will discuss this with Dr. Jamey Ripa.      PST/MEDQ  D:  05/05/2004  T:  05/05/2004  Job:  [id number]

## 2010-08-02 NOTE — H&P (Signed)
NAME:  [name], [name]                         ACCOUNT NO.:  [id number]   MEDICAL RECORD NO.:  [id number]                   PATIENT TYPE:  INP   LOCATION:  4740                                 FACILITY:  [facility]   PHYSICIAN:  Ulyess Mort, M.D. [facility]         DATE OF BIRTH:  07/03/1926   DATE OF ADMISSION:  02/25/2002  DATE OF DISCHARGE:                                HISTORY & PHYSICAL   CHIEF COMPLAINT:  A two days history of nausea and vomiting and no  significant bowel movements.   HISTORY OF PRESENT ILLNESS:  The patient is a pleasant 77 year old white  female with a long-standing history of partial small bowel obstructions as  well as small bowel obstructions.  She has required surgeries in the past  for lysis of adhesions, and has had numerous medical admissions for medical  treatment of partial small bowel obstructions.  In fact, she has three  admissions in 7/03, but has been pretty quiet since then.  Tends to have  intermittent episodes of abdominal pain with nausea and sometimes vomiting,  but if they do not last very long, she does not seek out medical attention.  However, during these admissions in July on one of the admissions she had  pancreatitis with undetermined etiology.  An ultrasound showed some slight  prominence of the common bile duct as to be expected in a post  cholecystectomy patient.  However, the distal common bile duct was poorly  visualized.  Later as an outpatient she underwent a CT scan of the abdomen  with and without contrast, and no focal abnormalities were seen in the  pancreas.  Common bile duct was prominent, but it tapered smoothly into the  pancreatic head and there was no CT evidence for mass or stone within the  duct, and no acute or chronic pancreatitis.  She did have an incidental left  renal cyst.  On another of these admissions during July, she had a  Klebsiella urinary tract infection.  On yet another of these admissions, she  was  upper endoscoped by Dr. Lina Sar, and she saw grade III esophagitis  with what looked like herpetic esophagitis.  Pathology did confirm herpetic  esophagitis and she was treated with appropriate anti-virals.  CLO testing  was performed, but it is not clear if those results were positive or not.   In the last couple of days, the patient has had recurrence of abdominal  pain, mostly below the umbilicus.  She has had nausea and vomiting and has  developed succulent emesis.  She was seen in the emergency room yesterday at  [facility] where an x-ray showed an ileus.  She was sent home with a  prescription for oral morphine as well as Phenergan.  These have not  resulted in any improvement.  She was seen in the office today by Dr. Victorino Dike, and was sent over for a direct admission  to the hospital for what  is almost certainly another partial small bowel obstruction.  The acute  abdominal series also showed some stable changes of right atelectasis and  scarring at the right base, and some old healed rib fractures bilaterally.  The patient is complaining of a yellow productive cough for the last three  weeks, and she has had this complaint on previous admissions.   ALLERGIES:  DEMEROL which has caused mental status changes in the past.   MEDICATIONS:  1. Darvocet-N 100 one p.o. t.i.d.  2. Prozac 40 mg p.o. q.d.  3. Requip 1 mg p.o. t.i.d. and at h.s.  4. Prilosec 20 mg p.o. q.h.s.  5. Amitriptyline 50 mg p.o. q.h.s.  6. Clonazepam 0.5 mg p.o. at h.s.  7. Zyrtec 10 mg p.o. at h.s.  8. Flexeril 10 mg p.o. q.h.s.  9. Celebrex 200 mg p.o. q.d.   PAST MEDICAL HISTORY:  1. Small bowel obstruction as well as partial small bowel obstructions.  She     had surgery twice in 1995, for lysis of adhesions.  These surgeries were     at [facility].  2. History of diverticulosis and diverticulitis.  3. Status post repair of perforated diverticulum at age 48.  4. Status post subtotal  colectomy in 1993 for complicated diverticulosis.  5. Status post open cholecystectomy.  6. Status post right cataract repair with complications requiring repeat     surgery.  7. Osteoarthritis.  8. Restless leg syndrome.  Of note, this can be quite dramatic at times and     looks more like Parkinson's when acute.  9. Depression and anxiety.  10.      Gastroesophageal reflux disease.  11.      Herpetic esophagitis.  12.      Osteopenia.   SOCIAL HISTORY:  The patient is a widow.  She has attentive children, and  her son is a Clinical research associate in [employer] and accompanies her today to the doctor's  office and to the hospital.  She has never been a smoker, does not drink  alcoholic beverages.   FAMILY HISTORY:  She has a daughter who has a diagnosis of celiac disease.  A sister had Crohn's, and is status post surgery for this.  At least a  couple of sisters who have had surgery because of diverticular disease.   REVIEW OF SYMPTOMS:  PULMONARY:  A three week history of yellow productive  cough, no significant increase in shortness of breath or dyspnea on  exertion.  GENERAL:  Denies fevers or chills.  She has not had significant  p.o. intake in the last 48 hours.  GASTROINTESTINAL:  Her last large bowel  movement was more then 48 hours ago.  She did have some small amount of  stooling yesterday.  She is burping a lot, however, she is not passing much  flatus.  MUSCULOSKELETAL:  Denies acute arthralgias.  NEUROLOGIC:  Her  restless leg syndrome has been stable of late.  She denies dizziness, denies  headache.  HEMATOLOGIC:  Has not had any GI bleeding or other types of  bleeding.  CARDIOVASCULAR:  No edema, no palpitations, no chest pain.  All  other systems were reviewed and were negative.   PHYSICAL EXAMINATION:  GENERAL:  The patient is a pleasant elderly female  who is very well preserved.  She is pale and is a little bit tremulous. VITAL SIGNS:  Pulse 105, blood pressure 152/69,  respirations 20, temperature  97.1, weight  yet to be determined.  The patient looks to be a little bit  heavier then the last time she was seen in the hospital in July.  HEENT:  Extraocular movements were intact.  There is no conjunctival pallor.  No icterus.  Oropharynx:  Mucous membranes are moist, there is no exudate in  the oral cavity.  NECK:  No masses, no JVD.  CHEST:  Clear to auscultation and percussion bilaterally.  However, there  are decreased breath sounds in both of the bases.  CARDIAC:  Tachycardic, but regular rhythm.  No murmurs, rubs, or gallops.  ABDOMEN:  Soft, nondistended.  Bowel sounds are hypoactive.  She has  tenderness bilaterally below the umbilicus, no guarding or rebound.  No  hepatosplenomegaly or masses.  No succession splash.  RECTAL:  Deferred.  BREASTS:  Deferred.  GENITOURINARY:  Deferred.  EXTREMITIES:  No cyanosis, clubbing, or edema.  NEUROLOGIC:  She has a slight tremor of the head.  Her grip strength is 5/5  bilaterally.  DERMATOLOGIC:  No obvious rashes or sores on the trunk or upper extremities.  There is some tenting to the skin and the forearms.  HEMATOLOGIC:  No obvious bruising of the upper extremities or trunk.   LABORATORY DATA:  These are all from yesterday.  Urinalysis was negative for  leukocyte esterase, nitrites, bilirubin, or glucose.  There was 100 mg of  protein.  She had trace blood which on microscopic turned out to be 0 to 2  red blood cell, no white blood cells, and no bacteria.  Sodium 138,  potassium 4.  BUN 21, creatinine 0.9.  Glucose 142.  Total bilirubin 0.5,  alkaline phosphatase 83, AST 25, ALT 14.  White blood cell count 10.1.  Hemoglobin 12.4, hematocrit 37.5, platelets 222, MCV 95.8.   ASSESSMENT:  1. Recurrent partial small bowel obstruction with extensive history of same.  2. Status post multiple abdominal and pelvic surgeries.  3. Restless leg syndrome.  The patient does not do well when medications     used  to control this are held.  4. Depression and anxiety.  5. History of idiopathic pancreatitis.  6. History of herpetic esophagitis.   PLAN:  The patient is to be admitted for NG tube placement and gastric  suctioning.  She is to be started on IV fluids for hydration.  Because this  patient does so poorly when her neurologic agents and psychiatric agents are  held, these will be continued with plans to clamp the tube for about 1-1/2  hours t.i.d. following administration of oral medications.  We are not going  to continue her Celebrex, however.  We have written for IV morphine and IV  Phenergan to be used as needed.  We will follow up abdominal films in the  morning.  Plan to get a BMET, amylase, and lipase today just to make sure  she does not have recurrent pancreatitis.     Brett Canales, P.A. [facility]                    Ulyess Mort, M.D. [facility]    SG/MEDQ  D:  02/25/2002  T:  02/26/2002  Job:  [id number]

## 2010-08-02 NOTE — H&P (Signed)
NAME:  [name], [name]                         ACCOUNT NO.:  [id number]   MEDICAL RECORD NO.:  [id number]                   PATIENT TYPE:  INP   LOCATION:  5735                                 FACILITY:  [facility]   PHYSICIAN:  Sean A. Everardo All, M.D. [facility]           DATE OF BIRTH:  1926-10-14   DATE OF ADMISSION:  09/10/2003  DATE OF DISCHARGE:                                HISTORY & PHYSICAL   REASON FOR ADMISSION:  Abdominal pain.   HISTORY OF PRESENT ILLNESS:  The patient is a 77 year old woman with 15  hours of severe, generalized abdominal pain.  There is associated nausea and  vomiting.   PAST MEDICAL HISTORY:  1. Multiple admission for abdominal pain.  2. Several abdominal surgeries, the result of which she states she only has     18 inches of colon left.  3. Anxiety/ depression.  4. Osteoporosis.  5. Gastroesophageal reflux disease.  6. Osteoarthritis.  7. Allergic rhinitis.  8. Restless leg syndrome.   MEDICATIONS:  1. Prozac 40 mg a day.  2. Requip at uncertain dosage.  3. Flexeril 10 mg t.i.d.  4. Elavil 50 mg q.h.s.  5. Prilosec 20 mg daily.  6. Zyrtec 10 mg daily.  7. Celebrex 200 mg daily.  8. Klonopin 0.5 mg p.r.n. anxiety.   SOCIAL HISTORY:  The patient is widowed.  She is retired.   FAMILY HISTORY:  No one else at home is ill.   REVIEW OF SYSTEMS:  Denies the following:  Fever, chills, rectal bleeding,  hematuria, loss of consciousness, chest pain, shortness of breath, dysuria,  skin rash, headache and visual loss.  She does have chronic diarrhea.   PHYSICAL EXAMINATION:  VITAL SIGNS:  Blood pressure 135/68, heart rate 76,  respiratory rate 20, temperature 97.9.  GENERAL:  No distress.  She appears slightly anxious.  SKIN:  Not diaphoretic.  HEENT:  Head is atraumatic.  Sclerae is nonicteric.  Pharynx is clear.  NECK:  Supple.  CHEST:  Clear to auscultation.  CARDIOVASCULAR:  No JVD, no edema.  Regular rate and rhythm, no murmur.  PULSES:  Pedal pulses  are intact.  ABDOMEN:  Soft.  There is slight diffuse tenderness.  No hepatosplenomegaly,  no hernia.  BREAST/ GYNECOLOGIC/ RECTAL:  Examination not done at this time due to  patient condition.  EXTREMITIES:  Mild osteoarthritic changes.  NEUROLOGIC:  Alert, well-oriented.  She moves all fours.  Cranial nerves  appear to be intact.   LABORATORY DATA:  Laboratory studies:  WBC 18,800, hemoglobin 13.4,  abdominal x-ray showed no evidence of obstruction.   IMPRESSION:  1. Recurrent abdominal pain of uncertain etiology.  2. Chronic short bowel syndrome.  3. Depression.  It is possible that her symptoms are exacerbated by     depression/ anxiety.  4. Other chronic medical problems as noted above.   PLAN:  1. Intravenous fluid.  2. I have discussed code status  with the patient.  She requests DNR.  3. Antibiotics.  4. Amylase.  5. Recheck CBC.  6. Clear liquid diet.  7. Ultrasound.  8. Symptomatic therapy.                                                Sean A. Everardo All, M.D. [facility]    SAE/MEDQ  D:  09/10/2003  T:  09/10/2003  Job:  [id number]

## 2010-08-02 NOTE — Discharge Summary (Signed)
. [facility]  Patient:    [name], [name] Visit Number: [id number] MRN: [id number]          Service Type: MED Location: [id number] Attending Physician:  Starr Sinclair Dictated by:   Sammuel Cooper, P.A. Admit Date:  09/19/2001 Discharge Date: 09/21/2001   CC:         Ulyess Mort, M.D. [facility]  Rafaela M. Riley Nearing, M.D., [facility], [place], [place].   Discharge Summary  ADMISSION DIAGNOSES: 4. 77 year old white female with recurrent partial small bowel obstruction    likely secondary to adhesions with multiple prior admissions for same. 2. Status post lysis of adhesions X2 in 1980s, prior appendectomy,    cholecystectomy, remote perforated diverticulitis in 1960s, status post    subtotal colectomy. 3. History of cataracts and retinal repair. 4. Restless leg syndrome. 5. Depression. 6. Gastroesophageal reflux disease.  DISCHARGE DIAGNOSES: 1. Resolved recurrent partial small bowel obstruction with negative small    bowel follow through. 2. Status post lysis of adhesions X2 in 1980s, prior appendectomy,    cholecystectomy, remote perforated diverticulitis in 1960s, status post    subtotal colectomy. 3. History of cataracts and retinal repair. 4. Restless leg syndrome. 5. Depression. 6. Gastroesophageal reflux disease.  CONSULTATIONS:  None.  PROCEDURES:  Plain abdominal films and small bowel follow through.  HISTORY OF PRESENT ILLNESS:  Briefly, the patient is a very nice 77 year old white female primary patient of Dr. Bernadette Hoit at [facility] in [facility], also known to Dr. Victorino Dike with history as described above. The patient was admitted in November 2002 with a partial small bowel obstruction and also in March of 2002, both times resolving with conservative management.  She is status post multiple abdominal surgeries as outlined above and has had lysis of adhesions X2 remotely.  At this time  she says she has been having some constipation over the past couple of days and then the evening prior to admission, after eating dinner, developed acute abdominal pain, nausea and vomiting as well as abdominal distention.  She presented to the emergency department on the morning of September 19, 2001.  KUB was consistent with a partial small bowel obstruction and she was admitted for medical management.  LABORATORY DATA:  On admission shows white blood cell count 12.8, hemoglobin 13.4, hematocrit 40.1, MCV 95.  Follow up on September 20, 2001 shows white blood cell count 9.0, hemoglobin 11.9, hematocrit 35.9, platelet count 240,000. Electrolytes were within normal limits with potassium of 5.6 on admission. This was followed up on September 20, 2001 and was 4.3. Glucose initially was 161, follow up on September 20, 2001 was 104. Coagulation studies within normal limits. Liver function tests within normal limits. Amylase 130, lipase 29. Urinalysis showed 3-6 white blood cells.  KUB on admission showed a partial small bowel obstruction. Follow up on September 20, 2001 showed decreased small bowel distention and small bowel follow through done on September 21, 2001 showed rapid transit and no evidence for small bowel obstruction.  HOSPITAL COURSE:  The patient was admitted to the service of Dr. Claudette Head who was covering on call.  The patient was initially placed on intravenous fluids, given Demerol and Phenergan as needed for pain and nausea.  She was not vomiting at the time of admission.  We did not place an nasogastric tube. She was kept at bowel rest.  The patient had a benign hospital course, the following morning was feeling much  better, had started having bowel movements on her own.  Abdominal films showed some improvement and she was started on a liquid diet.  The following day she was scheduled for small bowel follow through which was unremarkable with rapid transit.  The patient continued improvement.  We  advanced her diet without difficulty and she was allowed discharge to home later that day on September 21, 2001.  FOLLOW UP:  The patient is to follow up with Dr. Victorino Dike on a p.r.n. basis and to maintain her regular follow up with her primary care physician.  MEDICATIONS:  All medications same as on admission including: 1. Zyrtec 10 mg p.o. q.d. 2. Darvocet-N 100 one q.6h. p.r.n. 3. Requip 1 mg qid 4. Clonazepam 0.5 q.h.s. 5. Prilosec 20 mg q.d. 6. Celebrex 200 q.d. 7. Prozac 40 mg q.d. 8. Amitriptyline 50 q.h.s. 9. Cyclobenzaprine 10 mg q.h.s.  CONDITION ON DISCHARGE:  Stable and improved. Dictated by:   Sammuel Cooper, P.A. Attending Physician:  Starr Sinclair DD:  09/21/01 TD:  09/24/01 Job: [id number]

## 2010-08-02 NOTE — Discharge Summary (Signed)
NAME:  Janice, Miller                         ACCOUNT NO.:  [id number]   MEDICAL RECORD NO.:  [id number]                   PATIENT TYPE:  INP   LOCATION:  5735                                 FACILITY:  [facility]   PHYSICIAN:  Rene Paci, M.D. [facility]          DATE OF BIRTH:  [birth year]   DATE OF ADMISSION:  09/09/2003  DATE OF DISCHARGE:  09/12/2003                                 DISCHARGE SUMMARY   DISCHARGE DIAGNOSES:  1. Abdominal pain.  2. Nausea, vomiting.  3. Acute gastritis.  4. Urinary tract infection.   BRIEF ADMISSION HISTORY:  [name] is a 77 year old white female who has a  history of recurrent partial small-bowel obstructions. This is often  associated with gram negative rod and urinary tract infections. She was last  admitted January 2005.   PAST MEDICAL HISTORY:  1. History of small-bowel obstruction and partial small-bowel obstructions     status post exploratory laparotomy and lysis of adhesions x2 in 1995.  2. Diverticulosis/diverticulitis, status post repair secondary to     perforation at age 82, status post subtotal colectomy in 1993.  3. Probable short gut syndrome.  4. Status post open cholecystectomy.  5. History of cataract surgery.  6. Osteoarthritis.  7. Osteopenia.  8. Restless leg syndrome.  9. Gastroesophageal reflux disease.  10.      History of herpetic esophagitis in July of 2003.  11.      Depression and anxiety.  12.      Idiopathic pancreatitis in July of 2003.  13.      Status post bilateral tubal ligation.  14.      Frequent recurrent urinary tract infections. Workup per Dr. Etta Grandchild.   HOSPITAL COURSE:  1. Gastrointestinal. The patient presented with abdominal pain, nausea,     vomiting. She was admitted to rule out partial small-bowel obstruction.     Plain films were negative for ileus or obstruction. She did have evidence     of a urinary tract infection which may be part of her pain. The patient     underwent endoscopy during  this admission that revealed acute gastritis     which also may have contributed to her abdominal pain. The patient's is     symptomatically improved and is tolerating a regular diet. GI recommended     no further NSAIDs for now.  2. Infectious disease. The patient is afebrile. Her white count was 18,000     on admission but has normalized to 6.9. Urinalysis was consistent with a     UTI; however, no cultures were obtained. Blood cultures are negative. She     did receive two days of empiric Unasyn and then was changed to Cipro to     complete a full 7-day course.  3. Hypotension. This has resolved.  4. Normocytic anemia. She was hemodynamically stable, and no evidence of     active GI bleed.  5.  Hyperglycemia. Serum glucoses ranged from 120 to 160. She has no prior     history of diabetes. We will check a hemoglobin A1c prior to discharge     but will defer further evaluation to her outpatient primary care     physician.   LABS AT DISCHARGE:  Blood cultures were negative x2. BUN 35, creatinine 1.5.  Hemoglobin 11.9. Amylase 191. Glucose 161.   MEDICATIONS AT DISCHARGE:  1. Prozac 40 mg q.d.  2. Requip as at home.  3. Elavil 50 mg q.d.  4. Prilosec 20 mg q.d.  5. Zyrtec 10 mg q.d.  6. Flexeril 10 mg 3 times a day.  7. Cipro 250 mg twice daily, last dose September 16, 2003.  8. Klonopin 0.5 mg p.r.n.   She has been instructed not to take Celebrex or any other nonsteroidal anti-  inflammatories.   FOLLOW UP:  With Dr. Ruthine Dose in 1 to 2 weeks.      Cornell Barman, P.A. [facility]                  Rene Paci, M.D. [facility]    LC/MEDQ  D:  09/12/2003  T:  09/12/2003  Job:  [id number]   cc:   Angelena Sole, M.D. [facility]   Ulyess Mort, M.D. [facility]

## 2010-08-02 NOTE — Discharge Summary (Signed)
NAME:  [name], [name]                         ACCOUNT NO.:  [id number]   MEDICAL RECORD NO.:  [id number]                   PATIENT TYPE:  INP   LOCATION:  5729                                 FACILITY:  [facility]   PHYSICIAN:  Sean A. Everardo All, M.D. [facility]           DATE OF BIRTH:  11-24-1926   DATE OF ADMISSION:  10/06/2003  DATE OF DISCHARGE:  10/07/2003                                 DISCHARGE SUMMARY   REASON FOR ADMISSION:  Abdominal pain.   HISTORY OF PRESENT ILLNESS:  Patient is a 77 year old woman admitted by  myself on October 06, 2003 for abdominal pain.  Please refer to dictated  history and physical for details.   HOSPITAL COURSE:  The patient was admitted and treated with symptomatic  therapy, IV fluids and antibiotics.  By October 07, 2003, she was alert and  oriented, eating her usual diet and ambulatory, and thus was discharged  home.   DISCHARGE DIAGNOSES:  1. Chronic, recurrent abdominal pain which may be due to adhesions, but may     have an element of irritable bowel syndrome as well.  2. Mild anemia noted.  3. Other chronic medical problems as noted in the history and physical.   MEDICATIONS:  1. Flexeril 10 mg 3 times daily.  2. Levsin SL 0.125 mg sublingual every 4 hours as needed for abdominal pain.  3. Elavil 50 mg q.h.s.  4. Prozac 40 mg daily.  5. Zyrtec 10 mg daily.  6. Non-prescription stool softener, 1 pill twice daily.  7. Requip 0.25 mg, 1 pill 3 times a day, then 2 h.s.  8. Prilosec 20 mg daily.  9. Klonopin 0. 5 mg as needed for anxiety.  10.      Phenergan 25 mg suppository twice daily as needed for nausea.   DISCHARGE INSTRUCTIONS:  No specific restrictions on diet or activity.   FOLLOW UP:  Dr. Ruthine Dose, [facility] within 2 weeks.  The  patient was advised to call for appointment. Focused follow-up anemia, would  need to be followed up if indicated.                                                Sean A. Everardo All, M.D. [facility]    SAE/MEDQ  D:  10/07/2003  T:  10/08/2003  Job:  [id number]   cc:   Angelena Sole, M.D. [facility]

## 2010-08-02 NOTE — Consult Note (Signed)
NAMEAKANKSHA, BELLMORE               ACCOUNT NO.:  [id number]   MEDICAL RECORD NO.:  [id number]          PATIENT TYPE:  INP   LOCATION:  1621                         FACILITY:  [facility]   PHYSICIAN:  Titus Dubin. Hopper, MD,FACP,FCCPDATE OF BIRTH:  [birth year]28   DATE OF CONSULTATION:  01/17/2006  DATE OF DISCHARGE:  01/17/2006                                   CONSULTATION   ADDENDUM:  Ms. Hauck was admitted January 14, 2006 with small bowel  obstruction, which is clinically and radiographically resolving.  On the  morning of November 3 the plan was to send her home, if she were able to eat  without discomfort or distention.  At this time she is afebrile, with a  temperature of 97.3, pulse 74, respiratory rate 20 and blood pressure  130/68.  Her abdomen is soft with active bowel sounds, and she has only  minimal tenderness.  There is no bladder distention.   She was documented to have urinary tract infection with greater than 100,000  E. coli.  She had been placed on Macrobid, but the pharmacy consultant  stated that this was contraindicated with her creatinine clearance of  approximately 30; her creatinine was 1.32.  The E. coli is resistant to  Cipro and levofloxacin, as well as trimethylene sulfa.  It is sensitive to  amoxicillin.  The other agents to which it is sensitive are parenteral  medications. She stated that she does have amoxicillin at home, which may be  a year or more out of date.  I will provide a prescription for amoxicillin  500 mg twice a day (dispense 20).  I will ask her to follow up with Dr.  Laury Axon and her urologist concerning the recurrent urinary tract infections.   It is anticipated that she will be discharged.  This note is dictated to  facilitate continuity of care concerning the presence of the urinary tract  infection and the sensitivities as documented above.      Titus Dubin. Alwyn Ren, MD,FACP,FCCP  Electronically Signed     WFH/MEDQ  D:  01/17/2006  T:   01/18/2006  Job:  [id number]   cc:   Angelia Mould. Derrell Lolling, M.D.  [address], [address]  [place]  [place] [address]   Loreen Freud, M.D.  [phone]. [address] [place]  [place] [address]

## 2010-08-02 NOTE — H&P (Signed)
NAME:  [name], [name]                         ACCOUNT NO.:  [id number]   MEDICAL RECORD NO.:  [id number]                   PATIENT TYPE:  INP   LOCATION:  5729                                 FACILITY:  [facility]   PHYSICIAN:  Sean A. Everardo All, M.D. [facility]           DATE OF BIRTH:  Mar 18, 1926   DATE OF ADMISSION:  10/06/2003  DATE OF DISCHARGE:                                HISTORY & PHYSICAL   REASON FOR ADMISSION:  Abdominal pain.   HISTORY OF PRESENT ILLNESS:  The patient is a 77 year old woman with 12  hours of moderate generalized abdominal pain not better nor worse in the  context of meals.  There is associated nausea or vomiting.  She was admitted  for similar symptoms in June 2005, as well as once in 2004.  Please refer to  my dictated History and Physical in June 2005.  She saw Dr. Terrial Rhodes  of gastroenterology two days ago and was told that her symptoms were due to  small bowel adhesions.   MEDICATIONS:  Please refer to the discharge summary of Dr. Felicity Coyer on September 12, 2003.   SOCIAL HISTORY:  Same as my dictation of September 10, 2003.   FAMILY HISTORY:  No other family member at home is ill.   REVIEW OF SYMPTOMS:  GENITOURINARY:  Denies rectal bleeding, melena,  hematuria, urinary frequency, dysuria.  CONSTITUTIONAL:  Denies fevers or  chills, headache, blurry vision.  CARDIOPULMONARY:  Denies cough, shortness  of breath, chest pain.  INTEGUMENTARY:  No skin rashes.  MUSCULOSKELETAL:  Denies arthralgias and back pain.   PHYSICAL EXAMINATION:  VITAL SIGNS:  Blood pressure 157/89, heart rate 85,  respirations 18, temperature 97.3.  GENERAL:  In no distress.  SKIN:  Not diaphoretic.  HEENT:  Head is atraumatic.  Sclerae nonicteric.  There is no periorbital  swelling.  Pharynx is clear.  NECK:  Supple.  CHEST:  Clear to auscultation.  CARDIAC:  No JVD.  No edema.  Regular rate and rhythm, no murmur.  Pedal  pulses are intact.  ABDOMEN:  Minimally distended.  It is  slightly and diffusely tender.  There  is no rebound tenderness.  Bowel sounds are active with no  hepatosplenomegaly.  BREASTS/GENITALIA/RECTAL:  Not done at this time due to the patient's  condition.  EXTREMITIES:  No deformity.  No ulcers present on the feet.  NEUROLOGIC:  Alert, well-oriented.  She does not appear anxious or depressed  at this time.  She readily moves all four extremities and cranial nerves  appear to be intact.   LABORATORY DATA AND X-RAY FINDINGS:  WBC 18,800; remainder of CBC and CMET  are normal.   IMPRESSION:  1. Recurrent abdominal pain apparently due to adhesions.  2. History of chronic short bowel syndrome which may be impairing the     therapy for #1.  The mechanism for this may that one may need to hesitate  to prescribe an osmotic laxative for fear of exacerbating her chronic     mild diarrhea.  However, I think, given her recurrent admissions, this     possibility needs to be revisited.  3. History of urinary tract infection during her recent hospital admission     that the patient feels may have recurred and may be worsening her     abdominal pain.  4. History of depression which may be worsening her symptoms.  5. Other chronic medical problems as noted in my History and Physical of     September 10, 2003.   PLAN:  1. Intravenous fluids.  2. Antibiotics.  3. Blood culture.  4. Check urinalysis.  5. Clear liquid diet.  6. We will try a chronic osmotic laxative.  7. I discussed code status with the patient and she requests DNR.  8. Symptomatic therapy.                                                Sean A. Everardo All, M.D. [facility]    SAE/MEDQ  D:  10/06/2003  T:  10/06/2003  Job:  [id number]   cc:   Angelena Sole, M.D. [facility]

## 2010-08-02 NOTE — Discharge Summary (Signed)
[name], [name]               ACCOUNT NO.:  [id number]   MEDICAL RECORD NO.:  [id number]          PATIENT TYPE:  INP   LOCATION:  1319                         FACILITY:  [facility]   PHYSICIAN:  Titus Dubin. Alwyn Ren, M.D. [facility] OF BIRTH:  05-Dec-1926   DATE OF ADMISSION:  09/23/2005  DATE OF DISCHARGE:                                 DISCHARGE SUMMARY   ADMISSION DIAGNOSES:  Nausea, vomiting, diarrhea, with a history of small  bowel obstruction, clinical dehydration.   For complete history, please the dictation of September 23, 2005.   SUMMARY:  She is a 77 year old white female who has a longstanding history  of bowel dysfunction and recurrent small bowel obstruction since partial  colectomy for diverticulitis in 1983.  She is followed by Dr. Victorino Dike;  her surgeon is Dr. Cyndia Bent.   The patient presented to the office of Dr. Laury Axon with nausea, vomiting, and  diarrhea for less than 24 hours.  She was sent for a CT because of the  history of prior small bowel obstruction, but this could not be completed  because of the vomiting.  The patient was admitted for intravenous hydration  and observation.  The patient received IV fluids and had improvement of her  symptoms.  At the time of discharge, she stated she was 50% better, but  was ready to go home.  She stated that she has had diarrhea since 1983 and  her bowels are essentially back to her normal.  She has had no nausea or  vomiting.  Her appetite is poor which is her baseline.   She had been ambulating in the hall on two occasions in the 24 hours prior  to discharge.  She subjectively feels stronger.  On exam she was afebrile  and normotensive.  Pulse was 80 and regular.  Bowel sounds were active and  abdomen was soft.   Labs were reviewed with the patient.  She has 40,000 colonies of E. coli  which is resistant to oral medications except for nitrofurantoin.  She had  been on Cipro while in the hospital.  She will be  discharged on Macrobid  once a day for 7 days.   Because of the history of small bowel obstruction, she was changed from  Vicodin to Ultram.  Based on a creatinine clearance of approximately 20 mL,  the Ultram was to be prescribed as 50 mg 1/2 to 1 pill, up to 2 pils total  every 12 hours as needed.  The risks to her kidney function was discussed  with her and it was recommended that she minimize the pain medications as  much as possible.Perhaps she may be a candidate for low dose fentanyl via am  patch q 36 hours.   At the time of admission, her white count was 14,300; followup white count  was 9900.  Hematocrit was 44.8.  Initially she had a  left shift.  Initial  glucose was 174; followup was 90.  BUN was 40, creatinine 1.82; followup  creatinine and BUN were 1.95 and 45 respectively.  Her AST was 41 minimally  elevated with a normal ALT.   A CT revealed mild intrahepatic biliary dilatation and moderate to marked  dilatation of the common bile duct, essentially similar to September 02, 2004.  She had postoperative changes of probable adhesions involving the pelvic  bowel loops.  There was no evidence for bowel obstruction.  There was a disk  bulge or herniation at L4-L5 and L5-S1.   Her discharge status is improved.  Her prognosis is poor because of her  advanced age and multisystem disease.  She will be discharged on her home  medications with the addition of the tramadol and Macrobid.   She will be asked to see Dr. Laury Axon in 7 to 14 days, sooner if she is having  any further symptoms.  She will see Dr. Corinda Gubler as per his recommendations.      Titus Dubin. Alwyn Ren, M.D. [facility]  Electronically Signed     WFH/MEDQ  D:  09/27/2005  T:  09/27/2005  Job:  [id number]   cc:   Loreen Miller, M.D.  [address] [place]  [place] [address]   [name], M.D. [facility]  [address]  [place]  [place] [address]   Currie Paris, M.D.  [address], [address]  [place]  [place]  [address]

## 2010-08-02 NOTE — H&P (Signed)
[place]  Patient:    [name], [name]. Visit Number: [id number] MRN: [id number]          Service Type: Attending:  Jeralyn Bennett, M.D. Dictated by:   Dianah Field, P.A.-C Adm. Date:  01/31/01                           History and Physical  CHIEF COMPLAINT:  Abdominal pain with nausea and vomiting in patient with a history of small-bowel obstructions.  BRIEF HISTORY:  [name] is a nice 77 year old white female, who has a history of multiple abdominal surgeries because of diverticulitis and small-bowel obstructions.  She has been hospitalized in the past for small-bowel obstruction and most recently had a partial small-bowel obstruction.  She was admitted March 21 to the 23rd and responded to conservative measures.  Her GI physician is officially Dr. Victorino Dike, though Dr. Marina Goodell was in charge of her during her last hospitalization.  She did not require NG suction during this most recent admission.  The patient comes into the hospital today, saying that about two weeks ago she had a couple of days where she had severe abdominal pain with nausea and vomiting.  However, her symptoms relieved within 24 hours, though they did occur a couple of days apart from each other.  She had been doing well since then, though for the past few days, has been having some abdominal pain without nausea.  She had a bowel movement yesterday.  She had a good lunch and then at about 2:30 p.m., began having acute diffuse abdominal pain, though it was worse on the left.  She also began throwing up.  She threw up a total of about 12-15 times, pretty much emptying all of the contents of her stomach, and she did not have any hematemesis or coffee-ground material.  The pain continued through the night.  This was unresponsive to a total of 3 of Vicodin which she took intermittently.  She also tried taking some Klonopin, thinking this would help her sleep and relax,  but it did not help.  She also took Phenergan suppositories, and these did not help.  She came to the hospital early this morning for evaluation.  An acute abdominal series is showing some air-fluid levels in a nondilated small bowel with stool in the distal colon. There a calling this likely enteritis versus mild ileus.  Significantly, when patient has these bouts with partial small-bowel obstruction, the KUB is not always very specific but always does show some gas and fluid-filled loops of small bowel.  Her white blood cell count is 15.7 with a hemoglobin of 13.3. Urinalysis is negative, and the lipase is within normal limits.  The  plan at this point is to admit her to a regular floor for analgesics, IV fluids, and to keep her mostly n.p.o., though we will try to give her her p.o. medications and if this does not work, we will substitute with intravenous alternatives where possible.  PAST MEDICAL HISTORY: 1. History of partial small and small-bowel obstructions.  She has undergone    surgery on two occasions for bowel obstruction, and the latest of these was    in [place], [place] about three years ago.  She has had multiple    admissions for medical management of small-bowel obstructions as well, most    of these at [facility]. 2. Leukocytosis associated with prior partial  bowel obstruction during her    March 2002 admission. 3. History of diverticulitis.  Status post surgical repair of perforated    diverticulum along with appendectomy at age 68.  Status post subtotal    colectomy for complicated diverticulosis in 1993. 4. Status post laparoscopic tubal ligation. 5. Status post open cholecystectomy. 6. Status post right cataract repair with subsequent surgery due to    postoperative complications. 7. Osteoarthritis. 8. Restless leg syndrome. 9. Depression.  PRIMARY CARE PHYSICIAN:  Rafaela M. Riley Nearing, M.D., [facility], at [address],  [place], [place], [address].  ALLERGIES:  No known drug allergies.  CURRENT MEDICATIONS:  1. Vicodin p.r.n.  2. Phenergan p.r.n.  3. Amitriptyline 50 mg p.o. at h.s.  4. Neurontin, dose unknown, 1 at h.s.  5. Prilosec 20 mg p.o. q.d.  6. Zyrtec 10 mg p.o. q.d.  7. Flexeril 10 mg p.o. at h.s.  8. Clonazepam 0.5 mg p.o. t.i.d.  9. Requip 1 mg p.o. t.i.d. 10. Premarin 1.25 mg p.o. q.o.d.  She has recently started a tapering of this     medication and is taking it every other day, last dose taken was on     Friday. 11. Provera 2.5 mg p.o. q.o.d., also on tapering dose of this with every other     day dosing, last taken on Friday. 12. Celebrex.Dictated by:   Dianah Field, P.A.-C Attending:  Jeralyn Bennett, M.D. DD:  01/31/01 TD:  01/31/01 Job: [id number]

## 2010-08-02 NOTE — Consult Note (Signed)
NAME:  ABBEGALE, STEHLE                         ACCOUNT NO.:  [id number]   MEDICAL RECORD NO.:  [id number]                   PATIENT TYPE:  INP   LOCATION:  5730                                 FACILITY:  [facility]   PHYSICIAN:  Jimmye Norman III, M.D.               DATE OF BIRTH:  08-Feb-1927   DATE OF CONSULTATION:  04/10/2003  DATE OF DISCHARGE:                                   CONSULTATION   Dear Dr. Jonny Ruiz:   Thank you very much for asking me to see Ms. Kucher again, a very pleasant 77-  year-old woman who has had multiple admissions for partial small bowel  obstructions who is now being readmitted with similar difficulties in  abdominal discomfort.   HISTORY OF PRESENT ILLNESS:  The patient apparently started having problems  24-48 hours prior to this admission with severe abdominal pain and cramping  associated with nausea and vomiting by the patient's report.  Initially she  had large volumes of vomitus followed by mostly just dry heaves, however she  did vomit, while I was visiting her in her room, tea which she had had to  drink only a few minutes earlier.   Abdominal films demonstrate a few air fluid levels along with a positive gas  in the belly but not markedly distended small bowel loops.  There does  appear to be gas in the colon although this was not definitive.  There was  no evidence of free air.  On abdominal exam, she has a flat abdomen but not  scaphoid.  She is not very tender anywhere, no diffuse peritonitis.  She has  some active bowel sounds which do not appear to be tinkles and rushes.  Looking at her laboratory studies, her white count was 15.7 thousand at 135  this morning, but her CBC showed a BUN of 22, a creatinine of  .9.  She did  have a UA done which had positive nitrites, moderate leukocyte esterase,  many bacteria, 21-50 white cells, a few red cells and very few squamous  cells.  This likely represents a urinary tract infection although we are  waiting on  the cultures.   IMPRESSION:  She does have urinary tract infection and perhaps an ileus  secondary to that associated with nausea and vomiting.  The patient does  report having had fevers and having chills at home.  Currently, although the  patient is vomiting I do not think that this would  be improved very much  with an NG tube, especially without markedly distended small bowel loops.  Therefore, I would like to give her a chance to see if the IV antibiotics  that she has been prescribed for her urinary tract infection will be  effective in resolving some of her symptoms.  If not, then an NG tube later  on may be helpful.  Subsequent to NG tube decompression, I think a small  bowel followthrough may be helpful to delineate any areas of obstruction in  her small bowel.                                               Kathrin Ruddy, M.D.    JW/MEDQ  D:  04/10/2003  T:  04/10/2003  Job:  [id number]

## 2010-08-02 NOTE — H&P (Signed)
[place]  Patient:    [name], [name] Visit Number: [id number] MRN: [id number]          Service Type: MED Location: [phone] Attending Physician:  Mervin Hack Dictated by:   Dianah Field, P.A. Admit Date:  10/08/2001                           History and Physical  DATE OF BIRTH:  04-09-1926  CHIEF COMPLAINT:  Recurrent nausea/vomiting, and diffuse abdominal pain.  HISTORY OF PRESENT ILLNESS:  The patient is a 77 year old white female who has an extensive history of both complete and partial small bowel obstructions. She recently had two separate admissions over the last couple of weeks for a partial small bowel obstruction.  The first was in early July, the second occurred July 17 through October 07, 2001.  During that second admission, besides the partial small bowel obstruction, she also was ruled in for Klebsiella UTI and pancreatitis.  The etiology of the pancreatitis was unclear but perhaps it was secondary to sludge or stones in the gallbladder, as she did have a bump in her transaminases, alkaline phosphatase, and total bilirubin, with AST of 145, ALT of 175, alk phos of 167, and total bilirubin of 2.0.  Amylase was as high as 503, lipase as high as 182.  Because of the bowel obstruction and her inability to keep down oral contrast, she was imaged with ultrasound.  This showed some slight prominence of the common bile duct beyond what would be expected of a patient post cholecystectomy.  However, with overlying bowel gas pattern the pancreas as well as the distal common bile duct were poorly visualized.  In any event, the UTI was treated with IV and then oral Cipro. She was to complete a further five-day course for a total of 10 days of Cipro at discharge.  For at least 36 hours she was tolerating p.o.s of liquids and then solids.  For the last two days prior to her discharge she had been having multiple loose  stools.  She was discharged yesterday morning.  By yesterday afternoon she was again having recurrent nausea and vomiting with diffuse abdominal pain, but really the pain was more prominent over in the left upper quadrant, left mid abdomen, and umbilical region.  Last bowel movement was scant amount last evening.  The nausea, vomiting, and pain continue.  Nausea is certainly more of a problem symptom for her at this point than the abdominal pain.  The patient contacted [facility] and was advised to come to the emergency room.  She did have a small bowel follow-through about July 7 during the first of the recent hospitalizations.  This showed rapid emptying into the colon.  There was some mild narrowing at the right lower quadrant in the region of the ileum in an area where surgical staples were visible and this was felt to be an area of narrowing but not of obstruction secondary to adhesions.  Successive abdominal films during her most recent hospitalization showed complete resolution of the small bowel obstruction on two separate films.  PAST MEDICAL HISTORY:  1. History of small bowel obstruction for which she required surgery twice     for lysis of adhesions in 1995.  2. History of diverticulosis and diverticulitis.  At age 32 she underwent     repair of a perforated diverticulum.  In 1993 she  underwent subtotal     colectomy for complicated diverticulosis.  3. Status post tubal ligation.  4. Status post open cholecystectomy.  5. Status post right cataract repair with repeat ocular surgery for     complications of that cataract repair.  6. Osteoarthritis.  7. Restless legs syndrome.  8. Anxiety and depression.  9. Gastroesophageal reflux disease. 10. Osteopenia.  SOCIAL HISTORY:  Please see the previous records for this information.  She does not smoke or drink.  She is a widow.  FAMILY HISTORY:  The daughter now informs me that she has recently been diagnosed with  celiac sprue.  A sister of the patient has Crohns disease and has had surgery because of this.  There are other sisters who also have had diverticulosis, diverticulitis, and also some small bowel obstructions.  ALLERGIES:  DEMEROL which caused mental status changes on her recent admission.  CURRENT MEDICATIONS:  1. Prilosec 20 mg p.o. q.d.  2. Zyrtec 10 mg p.o. q.d.  3. Flexeril 10 mg p.o. at h.s.  4. Celebrex 200 mg p.o. q.d.  5. Fosamax 75 mg p.o. every week.  6. Darvocet-N 100 p.r.n.  7. Phenergan 25 mg orally or per rectum p.r.n.  8. Requip 1 mg p.o. q.i.d.  9. Klonopin 0.5 mg p.o. at h.s. 10. Ciprofloxacin 500 mg p.o. b.i.d. - was to finish this on July 29.  REVIEW OF SYSTEMS:  Denies shortness of breath.  She is having headaches.  No cough.  No difficulty urinating or pain with urination.  She was last able to take her medications last evening but did not have this mornings medications. She did not fill the Cipro prescription; thus, her last dose was the IV dose in the morning yesterday.  She did not fill the prescription because she felt that she would not be able to keep it down even if she did fill it. Musculoskeletal with no significant back or joint pain.  The patient has old injury from trauma as a child to the left eye.  Vision from that eye is diminished.  PHYSICAL EXAMINATION:  GENERAL:  The patient is alert and relaxed.  VITAL SIGNS:  Temperature 99.3, pulse 97, respirations 20, blood pressure 185/96.  HEENT:  Sclerae are nonicteric, conjunctivae are pink.  Pupil on the right side is deformed from prior cataract surgery.  There is scarring on the cornea over the pupil on the left side.  Extraocular movements are intact.  Mucous membranes are somewhat dry.  No lesions or exudates.  NECK:  There is no JVD, masses, or bruits.  CHEST:  Clear to auscultation and percussion bilaterally.  However, she does have a dry cough with deep inspiration.  No increased work  of breathing.  CARDIAC:  There is regular rate and rhythm without murmurs, rubs, or gallops. S1 and S2 are audible.   ABDOMEN:  Quiet with very few, scant bowel sounds.  Do not hear any tinkling bowel sounds.  There is tenderness in the left upper quadrant, epigastrium, and left middle quadrant.  No guarding or rebound.  No hepatosplenomegaly or masses.  Abdomen is nondistended.  RECTAL:  Notable for empty vault, normal rectal tone.  No masses and no hemorrhoids.  GENITOURINARY AND BREAST:  Deferred.  EXTREMITIES:  There is no cyanosis, clubbing, or edema.  Feet are warm and dry.  Dorsalis pedis pulses are 3+ bilaterally.  DERMATOLOGIC:  No extensive hematoma or petechia involving the upper extremities.  Facial skin is abnormally taut in a patient who  denies any prior plastic surgery, almost tight in a scleroderma type of way.  However, her fingers show no changes consistent with connective tissue disease/scleroderma; i.e., there is no loss of folds in the fingers.  NEUROLOGIC:  The patient is alert and oriented x3.  She is fairly relaxed. Lower extremities display no involuntary movement.  No tremor.  LABORATORY DATA:  Amylase 124, lipase 45, total bilirubin 0.6, alkaline phosphatase 106, AST 22, ALT 29.  Hemoglobin 12.6; hematocrit 39; white blood cell count 14.6; platelets 366,000; MCV 96.  Sodium 139, potassium 4.4, BUN 8, creatinine 0.9, glucose 102.  Urinalysis is still pending on a clean catch specimen.  Two-view abdomen shows a nonspecific bowel gas pattern with a few air-fluid levels in couple of loops of colon which are nonspecific.  IMPRESSION: 1. Recurrent nausea, vomiting, and abdominal pain.  Symptoms consistent with    small bowel obstruction; however, two-view abdomen is not confirmatory of    small bowel obstruction.  We need to be remained that on prior    presentations initial films do not always show patterns consistent with    small bowel obstructions  but follow-up films very often will show a pattern    consistent with bowel obstructions for this patient. 2. Status post multiple prior abdominal and pelvic surgeries, two of which    were for small bowel obstructions for lysis of adhesions. 3. Recent idiopathic pancreatitis.  Currently there is no biochemical evidence    for pancreatitis having recurred. 4. Recent Klebsiella urinary tract infection.  Will await recheck of    urinalysis to decide whether to restart antibiotics.  She was about halfway    through a course of ciprofloxacin when she was discharged yesterday. 5. Restless legs syndrome.  Note that this became an acute problem during her    last admission when she had to be kept off of her p.o. medications.    However, benzodiazepines seemed to help control this problem once they    were initiated.  PLAN: 1. The patient may require general surgical evaluation, but I am reluctant to    call this given that her bowel gas pattern on todays KUB is not showing    that she has a bowel obstruction. 2. Await urinalysis findings to decide on whether to restart Cipro, though    with her white count being elevated we may do this empirically. 3. Plan to follow up chemistries, CBC, and single-view abdomen tomorrow. 4. Support the patient with IV fluids, bowel rest, and IV antiemetics and    analgesics as needed. Dictated by:   Dianah Field, P.A. Attending Physician:  Mervin Hack DD:  10/08/01 TD:  10/12/01 Job: [id number]

## 2010-08-02 NOTE — Discharge Summary (Signed)
[name], [name]                         ACCOUNT NO.:  [id number]   MEDICAL RECORD NO.:  [id number]                   PATIENT TYPE:  INP   LOCATION:  4740                                 FACILITY:  [facility]   PHYSICIAN:  Iva Boop, M.D. [facility]           DATE OF BIRTH:  1926-11-28   DATE OF ADMISSION:  02/25/2002  DATE OF DISCHARGE:  03/01/2002                                 DISCHARGE SUMMARY   ADMISSION DIAGNOSES:  1. Recurrent partial small-bowel obstruction with extensive history of the     same.  2. History of small-bowel obstruction requiring surgery twice in 1995 for     lysis of adhesions by Dr. Jamey Ripa. Numerous admissions over the last few     years for medical management of partial small-bowel obstruction.  3. History of diverticulosis and diverticulitis. She is status post repair     of a perforated diverticulum at age 63 and status post subtotal colectomy     in 1993 for complications of diverticulosis.  4. Status post open cholecystectomy.  5. History of chronic productive cough, may be related to sinus drainage,     and patient with history of sinus congestion requiring chronic     antihistamines.  6. Status post right cataract repair with complications requiring repeated     surgery.  7. Osteoarthritis.  8. Restless leg syndrome, managed with numerous medications.  9. Depression and anxiety, currently on Prozac 40 mg a day.  10.      Gastroesophageal reflux disease, on chronic protein pump inhibitor     therapy.  11.      Herpetic esophagitis in 7/03.  12.      Osteopenia.  13.      History of idiopathic pancreatitis in 7/03.   DISCHARGE DIAGNOSES:  1. Recurrent partial small-bowel obstruction, resolved with medical     management, involving bowel rest and nasogastric tube suction.  2. Status post multiple abdominal and pelvic surgeries as above.  3. Restless leg syndrome. No acute problems during this admission.  4. Acute renal insufficiency, owing to  dehydration, associated with bowel     obstruction, resolved with resolution of obstruction and hydration.  5. Anxiety.  6. Intravenous access problems requiring placement of PICC line.   CONSULTANTS:  None.   PROCEDURE:  PICC line was placed in radiology.   BRIEF HISTORY:  [name] is a pleasant 77 year old white female with a long  history of both complete and partial small-bowel obstructions as outlined  above. She had had three admissions for bowel obstruction in 7/03 but had  not required admission since them. During one of these admissions, she  actually was diagnosed with pancreatitis, the etiology was never determined.  A CT scan did not show any masses, stones, or ductal abnormalities. She had  had a small-bowel follow through on 09/20/01 showing narrowing in the ileum  near surgical staples, but this area was  not obstructed, but it did look  like an area of adhesions. She has also had a Klebsiella urinary tract  infection in July. She was upper endoscoped in July by Dr. Lina Sar  during hospitalization, and she saw Grade III esophagitis consistent with  herpetic esophagitis, both visually and on pathology. A CLOtest was  negative.   In the couple of days prior to this admission, she developed nausea,  vomiting, and lower abdominal pain. Emesis became feculent. She had been  seen on 12/11 by the emergency room staff, and x-ray did show an ileus. They  gave her a prescription for oral morphine and Phenergan and discharged her.  She returned to see Dr. Victorino Dike on 12/12, and he arranged for her to be  admitted to the hospital for recurrent small-bowel obstruction.   LABORATORY DATA:  Sodium 137, potassium 4.1. Glucose as high as 128. BUN  initially 75, corrected to 14. Creatinine 2.8, corrected to 1.0. Calcium  8.7. Amylase 214, lipase 15. Hemoglobin 12.7, hematocrit 38.4. MCV 95.6.  White blood cell count 9.7 and platelets 260.   IMAGING STUDIES:  A two-view abdomen  on Mar 19, 2023 showed nonspecific bowel gas  pattern following NG tube placement. Calcific density in the region of the  right iliac crest suggestive of appendicolith.   HOSPITAL COURSE:  Problem 1. Partial small-bowel obstruction. The patient  was admitted directly to the unit 5700. There,  she was started on IV  fluids, and NG tube was placed. Initial output from the NG tube was about  1,400 cc of dark odiferous fluid. The following day, the NG tube put out  about 150 cc, and she had a total of three bowel movements. The NG tube was  continued through the following day. On 12/14, it put 800 cc. The NG tube  was clamped that afternoon, and the patient was allowed sips of clear  liquids. The patient tolerated this quite well, and on 12/15, the NG tube  was pulled. Diet was advanced to a low residue diet. The patient did well  and was able to be discharged home on 12/16 in stable condition.  Problem 2. History of restless leg syndrome. On prior admission, when  attempts were made to hold patient's oral medications because of bowel rest  and attempts were made to provide IV alternatives, the patient's restless  leg syndrome flared quite markedly to the point where it looked more like  Parkinson's disease. Therefore, her oral medications were continued with the  NG tube clamped about an hour an half following administration of  medications three times daily. She tolerated this scheduled pretty well. She  was able to get her medication, and the clamping of the tube did not result  in acute GI distress.  Problem 3. Acute renal insufficiency. The patient's BUN and creatinine were  markedly elevated above her baseline which has been within normal limits for  the most part. Once she received IV fluid hydration, the patient's renal  function returned to normal.  Problem 4. History of pancreatitis. Amylase and lipase were checked, and there was no indication that she had any recurrence of idiopathic   pancreatitis. In addition to the CT scan of the abdomen that she has had in  July, she also had unremarkable ultrasound of the abdomen. Again, she is  status post cholecystectomy.  Problem 5. Status post multiple abdominal and pelvic surgeries. The  patient's bowel obstruction problems have required repeated admissions over  the last two to three  years. She had been seen while hospitalized in July by  Dr. Lindie Spruce, and he elected to not pursue any surgery at that point. One of  the problems with this patient is that although she symptomatically is very  classic for partial small-bowel obstruction, her x-rays do not always  confirm this; however, small-bowel follow through in July did confirm some  area of transition and narrowing in the region of the ileum but no absolute  obstruction. Case was discussed with Dr. Doreatha Martin, and decision was made to refer  her for outpatient evaluation with her  long-term surgeon, Dr. Jamey Ripa, to  see if he thinks surgery may help resolve the patient's current problems  with repeat small-bowel obstructions. This appointment is set up for 1/6.   DISCHARGE FOLLOWUP:  She is to be seen in the office by Dr. Jamey Ripa on 1/6.  She has no standing appointment with Dr. Doreatha Martin but knows to call him if she is  having any recurrence of GI symptomatology. Her primary care physician is  Dr. Ruthine Dose, and she can followup with her as needed.   CONDITION ON DISCHARGE:  Stable and improved.    DISCHARGE MEDICATIONS:  1. Darvocet N-100 one p.o. t.i.d.  2. Prozac 40 mg p.o. q.d.  3. Requip 1 mg p.o. t.i.d. and at h.s.  4. Prilosec 20 mg p.o. q.h.s.  5. Amitriptyline 50 mg p.o. q.h.s.  6. Clonazepam 0.5 mg p.o. at h.s.  7. Zyrtec 10 mg p.o. q.h.s.  8. Flexeril 10 mg p.o. q.h.s.  9. Celebrex 200 mg p.o. q.d.     Brett Canales, P.A. [facility]                    Iva Boop, M.D. [facility]    SG/MEDQ  D:  03/01/2002  T:  03/02/2002  Job:  [id number]   cc:   Currie Paris, M.D.  [address], [address]  [place]  [place] [address]  Fax: [phone]   [facility]   Angelena Sole, M.D. [facility]

## 2010-08-02 NOTE — Consult Note (Signed)
[name], KRAS               ACCOUNT NO.:  [id number]   MEDICAL RECORD NO.:  [id number]          PATIENT TYPE:  INP   LOCATION:  5707                         FACILITY:  [facility]   PHYSICIAN:  Cherylynn Ridges, M.D.    DATE OF BIRTH:  [birth year]   DATE OF CONSULTATION:  09/03/2004  DATE OF DISCHARGE:                                   CONSULTATION   REASON FOR CONSULTATION:  Small bowel obstruction.   HISTORY OF PRESENT ILLNESS:  [name] is a 77 year old female who has had  several admissions for small bowel obstructions, partial small bowel  obstruction, as well as ileus, resulting in multiple surgeries/lysis of  adhesions for such.  In addition, she underwent a colectomy secondary to  perforated diverticulitis back in 1993.  She was admitted for this  hospitalization with an less than 48-hour history of abdominal pain that was  diffuse, associated with nausea and vomiting.  Her CT scan showed a dilated  small bowel with air/fluid levels in the jejunum with a decompressed ileum  and constipation with increased stool in the left and sigmoid colon.  Abdominal films today confirm the same.   PAST MEDICAL HISTORY:  1.  Diverticulitis with perforation with a subtotal colectomy in 1993.  2.  Osteoarthritis.  3.  Osteopenia.  4.  Restless leg syndrome.  5.  Anemia.  6.  Status post appendectomy.  7.  GERD.  8.  Depression.  9.  Anxiety.  10. Idiopathic pancreatitis in 2003.  11. Partial small bowel obstructions/small bowel obstructions with multiple      surgeries, lysis of adhesions.  12. History of herpetic esophagitis in 2003.  13. Status post open cholecystectomy.  14. History of cataract extractions.  15. Status post bilateral tubal ligation.   ALLERGIES:  DEMEROL.   MEDICATIONS DURING ADMISSION:  Rocephin, Lovenox, Prozac and Protonix.   SOCIAL HISTORY:  She is retired.  She is a widow.  She lives in [place],  [place].  She denies any tobacco, alcohol or  illicit drug use.   FAMILY HISTORY:  She has sisters with Crohn's disease, as well as breast  cancer.  She has a daughter with celiac disease.   LABORATORY STUDIES:  Initially her white count was 23,400.  This was  repeated and was interestingly normal on the same day at 9500 and repeat  today was 6200.  Hemoglobin and hematocrit normal.  Her BUN was 30 and  creatinine 2.1.  Initially her BUN on admission was 24 with a creatinine of  1.2.  Urinalysis revealed proteinuria.  TSH 7.390.  D-dimer 1.26.   PHYSICAL EXAMINATION:  VITAL SIGNS:  Temperature 99.2 degrees, pulse 96,  respirations 20, blood pressure 153/69, O2 saturation 90% on room air.  GENERAL APPEARANCE:  She is in no acute distress.  HEENT:  Grossly normal.  Normal conjunctivae.  Sclerae clear.  No nares  discharge.  NECK:  No carotid or supraclavicular bruits.  No JVD or thyromegaly.  CHEST:  Clear to auscultation bilaterally.  No wheezing or rhonchi.  HEART:  Regular rate and rhythm.  No  gross murmur, rub or ectopy.  ABDOMEN:  She has multiple abdominal scars.  She is diffusely tender, but  has more localized left lower quadrant tenderness.  EXTREMITIES:  Lower extremity with no peripheral edema.  No clubbing and no  cyanosis.  NEUROLOGIC:  Cranial nerves II-XII grossly intact.  She is alert and  oriented x 3.   ASSESSMENT:  1.  Small bowel obstruction.  2.  History of multiple small bowel obstructions/partial small bowel      obstructions with lysis of adhesions.  3.  History of ruptured diverticulum with a subtotal colectomy in 1993.  4.  Idiopathic pancreatitis in 2003.  5.  Osteoarthritis/osteopenia.  6.  Anemia.  7.  Renal insufficiency.  8.  Depression/anxiety.  9.  Gastrointestinal reflux disease.  10. Restless leg syndrome.  11. Herpetic esophagitis in 2003.  12. Elevated D-dimer as per primary physician.  13. Status post appendectomy.  14. Status post open cholecystectomy.  15. History of bilateral tubal  ligation.  16. History of cataract extractions.   PLAN:  The patient has been seen and examined by Dr. Lindie Spruce.  At this point,  we will keep her NPO and continue aggressive IV fluid rehydration.  If she  begins to have intractable nausea and vomiting, we will place her under care  with an NG tube.  We will follow serial abdominal films over time.  She  could ultimately need surgical treatment, however, we will attempt medical  treatment at this time.       LB/MEDQ  D:  09/03/2004  T:  09/03/2004  Job:  [id number]

## 2010-08-02 NOTE — Discharge Summary (Signed)
[place]. [facility]  Patient:    [name], [name]                      MRN: [id number] Adm. Date:  [id number] Disc. Date: [id number] Attending:  Estella Husk Dictator:   Dianah Field, P.A.C. CC:         Rafaela M. Riley Nearing, M.D., [facility] [address], [place], [place] [address]                           Discharge Summary  DATE OF BIRTH:  03/12/1927.  ADMISSION DIAGNOSES:  1. Abdominal pain with nausea and vomiting.  Probably early partial     small-bowel obstruction.  Symptoms are less likely representative of acute     recurrent diverticulitis.  2. History of small-bowel obstructions for which she has undergone surgery     on two occasions, the latest being at [facility], [facility], about     two and a half years ago.  She has required multiple admissions for     medical management of small-bowel obstructions as well.  All of these     have been at [facility].  3. Leukocytosis, suspect this is reactive as the patient is afebrile.  4. History of diverticulitis.  Status post surgical repair of perforated     diverticulum along with appendectomy age 30.  Status post subtotal     colectomy for complicated diverticulosis in 1993.  5. Status post laparoscopic tubal ligation.  6. Status post open cholecystectomy.  7. Status post right cataract repair with subsequent requirements for further     surgery due to postoperative complications.  8. Osteoarthritis.  9. Restless leg syndrome. 10. Depression.  DISCHARGE DIAGNOSES: 1. Recurrent partial small-bowel obstruction, resolved. 2. Leukocytosis, reactive. Resolved. 3. Hyperglycemia to serum glucose level of 159 on admission, did not have    recurrent hyperglycemia on subsequent glucose monitoring.  CONSULTATIONS:  None.  PROCEDURES:  None.  BRIEF HISTORY:  [name] is a very pleasant 77 year old white female with above noted history of multiple abdominal surgeries  owing to both diverticulosis, diverticulitis and small-bowel obstructions.  She periodically gets bouts with abdominal pain and may or may not have nausea and vomiting with these.  Generally they last less than 24 hours and she basically rides them out.  About two weeks ago, she had an episode that lasted a little bit longer than the usual 24 hours.  She went to see her primary physician who treated her with Cipro and Flagyl for possible diverticulitis.  She did get a pain and possibly an antiemetic shot in the office at that time.  She went home, slept it off, and the next morning the symptoms were gone. She did not feel that her symptoms were due to diverticulitis so she never did fill the antibiotic prescriptions.  She now comes to the emergency room at [facility] with symptoms of nonfocal diffuse abdominal pain which occurred suddenly at about 2:30 on the day prior to admission.  She took Darvocet-N 100 which provided no relief.  She threw up some partially digested food initially, and then later on, threw up yellow bilious emesis.  Her last stool had been on the morning prior to admission.  Pain is quite severe.  KUB obtained in the emergency room showed nondistended gas and fluid filled loops of  small-bowel which were nonspecific in pattern but questionably representing an early small-bowel obstruction.  There were mild interstitial opacities on the chest x-ray.  Her white count was elevated at 15.1 with a left shift present.  Urinalysis was negative as were amylase, lipase, and LFTs.  [name]. Janice Keys., M.D., admitted her to an inpatient bed for supportive care of what he felt was early partial small-bowel obstruction.  Other possibilities included acute gastroenteritis.  LABS:  Initial white blood cell count 15.1, normalized to 9.1.  Hemoglobin range of 12.7 to 11.2, hematocrit range 37.1 to 33.2.  MCV normal at 98.3. Platelets 224.  A differential on initial  presentation showed 90% neutrophils, 8% lymphocytes, and 2% monocytes with eosinophil and basophil count normal. Sodium was 137, chloride 113.  Glucose was 159 but normalized to 105.  BUN ranging 23 down to 14, creatinine range of 0.8 to 0.9.  Amylase 127, lipase 23.  Urinalysis was notable for 15 mg of ketones and 30 mg of protein.  There were no nitrites nor leukocyte esterase present. Rare epithelial and only 0 to 5 white blood cells present.  Rare bacteria and hyaline casts present.  RADIOLOGIC STUDIES:  Initial KUB showed some nondistended gas and fluid filled loops of small-bowel in a nonspecific pattern, questionably representing an early small-bowel obstruction.  Chest films showed some mild interstitial opacities.  Follow-up KUB on June 05, 2000, showed no acute abnormality and no evidence for bowel obstruction.  HOSPITAL COURSE:  [name] was admitted to a nontelemetry bed in stable condition.  She was quite uncomfortable but after receiving some morphine as well as Phenergan, she was quite comfortable.  That evening on the following day she was able to take the oral medications which she was using as an outpatient.  IV fluids were provided for hydration.  She was not allowed anything by mouth other than sips of water with her medication administration along with ice chips.  Her initial exam did reveal some hypoactive bowel sounds but no tinkling or rushing sounds and minimal abdominal tenderness to palpation.  On hospital day #2, the patient had a follow-up KUB which revealed a nonobstructive bowel gas pattern.  She had had a small bowel movement and had not required any morphine since the afternoon the day before. Even her appetite was returning and she was hungry.  Her diet was advanced from the sips and chips up to full liquid.  She tolerated this well.  The following day she had no further complaints.  She was nausea, vomiting, and abdominal pain-free.  She had another  bowel movement.  Dr. Marina Goodell saw her and arranged for her discharge to home.  As far as GI follow-up, she was to follow up with  Ulyess Mort, M.D., as needed.  No appointment was required for follow-up unless she developed recurrent problems.  At this point, no plans for small-bowel follow-through or further work-up were initiated.  In the future, should she have recurrences of pain requiring hospitalization, it may be time to get a small-bowel follow-through.  She was given prescriptions for oxycodone as well as Phenergan suppositories should she need it for recurrent pain.  The oxycodone was in addition to Darvocet-N 100 which she does already have available for p.r.n. use.  MEDICATIONS AT DISCHARGE:  1. Prilosec 20 mg p.o. q.d.  2. Zyrtec 10 mg p.o. q.d.  3. Flexeril 10 mg p.o. at h.s.  4. Clonazepam 0.5 mg p.o. t.i.d.  5. Requip 1 mg p.o.  t.i.d.  6. Premarin 1.25 mg p.o. q.d.  7. Provera 2.5 mg p.o. q.d.  8. Celebrex 200 mg p.o. q.d.  9. Fosamax 75 mg p.o. q. week. 10. Darvocet-N 100 p.r.n. 11. Oxycodone one to two p.o. q.6h. p.r.n. for severe pain refractory to     Darvocet. 12. Phenergan suppositories 25 mg p.r. q.4-6h. p.r.n. nausea.  DIET:  Low fiber and low residue diet.  ACTIVITY:  Not restricted. She was okay to drive but was advised to take it easy until she felt back to her normal energy and activity level. DD:  06/15/00 TD:  06/16/00 Job: [id number]

## 2010-08-02 NOTE — Discharge Summary (Signed)
NAMEKIRSTIN, [name]                         ACCOUNT NO.:  [id number]   MEDICAL RECORD NO.:  [id number]                   PATIENT TYPE:  INP   LOCATION:  5730                                 FACILITY:  [facility]   PHYSICIAN:  Rene Paci, M.D. [facility]          DATE OF BIRTH:  03/07/1927   DATE OF ADMISSION:  04/10/2003  DATE OF DISCHARGE:  04/11/2003                                 DISCHARGE SUMMARY   DISCHARGE DIAGNOSES:  1. Abdominal pain.  2. Nausea.  3. Gram negative rod urinary tract infection.   BRIEF ADMISSION HISTORY:  [name] is a 77 year old white female who  presented with a two to three day history of abdominal pain.  She describes  a diffuse abdominal pain associated with nausea.  She has had a bowel  movement.  She had not had any vomiting or fevers.   PAST MEDICAL HISTORY:  1. Recurrent small bowel obstruction, secondary to adhesions.  Her last     admission was in December 2003.  2. Diverticulosis and diverticulitis, status post repair, history of a     diverticula 31 years ago, status post subtotal colectomy in 1993.  3. Status post cholecystectomy.  4. Status post bilateral cataracts.  5. Degenerative joint disease.  6. Restless leg syndrome.  7. Anxiety/depression.  8. Gastroesophageal reflux disease.  9. History of herpetic esophagitis in July 2003.  10.      Osteopenia.  11.      Idiopathic pancreatitis in July 2003.  12.      Status post bilateral tubal ligation.  13.      Ongoing bowel incontinence.   HOSPITAL COURSE:  Problem 1.  Abdominal pain - The patient was admitted to  rule out recurrent partial small bowel obstruction versus small bowel  obstruction.  The patient was seen in consultation by both surgery and  gastrointestinal.  There was no evidence of obstruction.  It was not felt NG  tube decompression was indicated.  They just recommended conservative  treatment.  As her abdominal pain resolved, diet was slowly advanced.  She  is currently  tolerating her diet.  Problem 2.  ID  - The patient showed evidence of a urinary tract infection.  She was empirically started on Cipro and Flagyl.  Urine cultures grew gram-  negative rod.  The patient was changed to oral Cipro and her Flagyl was  discontinued.  Problem 3.  Hyperglycemia without prior history of diabetes - The patient's  blood sugars have been in the 130s.  We suspect that these are acutely  exacerbated because of her underlying urinary tract infection, but this does  need further follow-up as an outpatient.   LABORATORIES AT DISCHARGE:  Urine culture; gram negative rods.  White count  was 15,000 on admission.   DISCHARGE MEDICATIONS:  She is to resume her home medications including:  1. Prozac 40 mg q. day.  2. Cyclobenzaprine 10 mg q.  day.  3. Zyrtec 10 mg q. day.  4. Klonopin 0.5 mg q. day.  5. Celebrex 200 mg q. day.  6. Prilosec 20 mg q. day.  7. Requip as at home.  8. Elavil 50 mg q. day.   Additionally, Cipro 500 mg b.i.d. for eight days.   She is to follow-up with Dr. Angelena Sole on Tuesday, February 8 at 1:20  p.m.      Cornell Barman, P.A. [facility]                  Rene Paci, M.D. [facility]    LC/MEDQ  D:  04/11/2003  T:  04/12/2003  Job:  [id number]   cc:   Angelena Sole, M.D. [facility]   Currie Paris, M.D.  [address]  [place]  [place] [address]  Fax: [phone]   Claudette Head, M.D.

## 2010-08-02 NOTE — Consult Note (Signed)
[name], [name]               ACCOUNT NO.:  [id number]   MEDICAL RECORD NO.:  [id number]          PATIENT TYPE:  INP   LOCATION:  5159                         FACILITY:  [facility]   PHYSICIAN:  Vida Roller, M.D.   DATE OF BIRTH:  [birth year]   DATE OF CONSULTATION:  11/29/2004  DATE OF DISCHARGE:                                   CONSULTATION   PRIMARY CARE PHYSICIAN:  Dr. Shade Flood.   CARDIOLOGIST:  New.   The patient is also followed by Dr. Ulyess Mort in GI.   REASON FOR CONSULTATION:  Ms. [name] is a 77 year old woman who has a long  history of bowel obstructions with multiple admissions for decompression,  usually with NG tube.  She has no known coronary artery disease.  She was  admitted two days ago with nausea and vomiting and abdominal pain and  diaphoresis.  No chest discomfort or shortness of breath at that time.  She  was markedly hypertensive with blood pressures of 215/100.  She developed  this afternoon discomfort in her chest which radiated to her right jaw.  It  was associated with some diaphoresis.  She says that the discomfort is still  there on a relatively low level.  She was started on some nitroglycerin with  some decrease in the discomfort.  She denies any shortness of breath.  No  radiation of the discomfort beyond it to the jaw.  She has no palpitations,  no pre-syncope, no syncope.  No PND or orthopnea.   PAST MEDICAL HISTORY:  1.  Significant for a recurrent small bowel obstructions.  2.  She has a history of anemia.  3.  Depression.  4.  A history of diverticular disease.  5.  There is some question as to whether or not she has an inflammatory      bowel syndrome.   PAST SURGICAL HISTORY:  1.  An open cholecystectomy.  2.  A tubal ligation.   MEDICATIONS:  1.  Cipro 400 mg IV q.12h.  2.  Protonix 40 mg IV daily.  3.  Dilaudid 1 mg q.3h. p.r.n.  4.  Nitroglycerin.   SOCIAL HISTORY:  She lives in [place] by herself.  She is retired.   She  does not smoke, drink or use illicit drugs.  She never has.   FAMILY HISTORY:  Her mother died of unknown causes at age 40.  Her father  died of a myocardial infarction at age 69.  She has seven sisters and two  brothers.  Some of her sisters have Crohn's disease.  Both of her brothers  died, one of diphtheria, the other one in a motor vehicle accident.   ALLERGIES:  No known drug allergies.   REVIEW OF SYSTEMS:  She denies any fevers or chills, weight loss or  adenopathy.  No headache, sinus tenderness, nasal discharge, bleeding, voice  changes, vertigo, photophobia, hearing or vision loss.  She has no problems  with her skin.  She has the discomfort in her chest, but no shortness of  breath, dyspnea on exertion, orthopnea, PND, edema, palpitations, pre-  syncope or syncope, claudication, cough or wheezing.  No urinary frequency  or urgency.  No hematuria, no nocturia.  No weakness, numbness or mood  disturbances.  No myalgias or arthralgias.  She does have the abdominal pain  and the nausea and vomiting.  She has an NG tube in place.  No polyuria, no  polydipsia.  The remainder of the review of systems is negative.   PHYSICAL EXAMINATION:  VITAL SIGNS:  Temperature 97.7 degrees, pulse 99,  respirations 20, blood pressure 90/50.  Saturation 98% on room air.  HEENT:  Unremarkable.  NECK:  Supple, no jugular venous distention or carotid bruits.  CHEST:  Clear to auscultation bilaterally.  CARDIOVASCULAR:  Regular, mildly tachycardic with no obvious murmur.  Point  of maximum impulse is not displaced.  ABDOMEN:  Soft.  It is diffusely tender.  There are bowel sounds.  GENITOURINARY:  Deferred.  RECTAL:  Deferred.  BREASTS:  Exam deferred.  SKIN:  Without lesions.  EXTREMITIES:  Without clubbing, cyanosis or edema.  Pulses are all 2+.  MUSCULOSKELETAL:  Normal.  NEUROLOGIC:  Normal.   There is no chest x-ray.   Her electrocardiogram showed a sinus rhythm at a rate of 99, with  low  voltage.  She has small T-waves in the inferior leads, but no ischemic ST-T  wave changes.   LABORATORY DATA:  White blood cell count 15.2, H&H 12 and 38, platelet count  225.  Sodium 142, potassium 5.1, chloride 111, bicarbonate 22, BUN 21,  creatinine 1.2, blood sugar 175.  INR 0.9, PTT 30.  Liver function tests  appear to be within normal limits.   IMPRESSION:  This is a woman with atypical chest discomfort who has a  nasogastric tube in place, who has no known risk factors for coronary artery  disease, who has a small bowel obstruction, who has labile hypertension.   RECOMMENDATIONS:  To do an echocardiogram to assess her LV systolic  function, to see if there are any wall motion abnormalities.  I think we  should cycle her enzymes.  She probably belongs on a telemetry bed.  I do  not know that nitroglycerin really is making an enormous difference in the  cardiac side of things.  It may be relaxing her  esophagus with the NG tube in place.  I have a low index of suspicion for  this being active coronary disease, but she certainly has the potential for  this.  I would be interested in the results of the echocardiogram and the  initial set of laboratories to make a good decision as to how further to  proceed.      Vida Roller, M.D.  Electronically Signed     JH/MEDQ  D:  11/29/2004  T:  11/29/2004  Job:  [id number]   cc:   Ulyess Mort, M.D. [facility]

## 2010-08-02 NOTE — H&P (Signed)
[place]. [facility]  Patient:    [name], [name] Visit Number: [id number] MRN: [id number]          Service Type: MED Location: [phone] Attending Physician:  Starr Sinclair Dictated by:   Sammuel Cooper, P.A. Admit Date:  09/19/2001   CC:         [name], M.D., [facility], [place], [place]                         History and Physical  CHIEF COMPLAINT:  Abdominal pain, nausea and vomiting, with history of multiple prior small-bowel obstructions.  HISTORY:  The patient is a very nice 77 year old white female, well-known to Dr. Ulyess Mort, primary physician of Dr. Genia Del. Riley Miller of [facility].  The patient has a history of multiple recurrent small-bowel obstructions.  She was admitted in November of 2002 and March of 2002 and has had several prior admits over the past few years for small-bowel obstructions, all of which have resolved with conservative management.  The patient is status post appendectomy, perforated diverticulum which required surgery in 1962, she has had a remote D&C, is status post subtotal colectomy in 1983 for diverticular disease, has had bilateral tubal ligation remotely, is status post cholecystectomy in the 1990s, status post right cataract extraction and retinal repair, has history of depression and restless leg syndrome, also status post lysis of adhesions x2, both in 1995 per Dr. Currie Paris.  The patient states that over the past couple of days she has had some increased constipation symptoms and then last evening after eating dinner out, developed abdominal crampy pain primarily in her left abdomen associated with abdominal distention and then multiple episodes of nausea and vomiting.  She says this is very similar to her prior episodes of obstruction and came to the emergency room where a KUB is consistent with a partial small-bowel obstruction.   She is admitted at this time for medical management.  LABORATORY STUDIES:  WBC of 12.8, hemoglobin 13.4, hematocrit of 40.1, MCV of 95, platelets 239,000.  Electrolytes and liver function studies within normal limits.  Glucose is 160.  MEDICATIONS: 1. Zyrtec 10 mg p.o. q.d. 2. Darvocet-N 100 one every four to six hours as needed. 3. Requip 1 mg q.i.d. and at times, takes two h.s. 4. Clonazepam 0.5 mg h.s. 5. Prilosec 20 mg q.d. 6. Celebrex 200 mg q.d. 7. Prozac 40 mg q.d. 8. Amitriptyline 50 mg h.s. 9. Cyclobenzaprine 10 mg h.s.  ALLERGIES:  No known drug allergies.  PAST HISTORY:  As outlined above.  SOCIAL HISTORY:  The patient is widowed over the past 10 years, lives alone, remains quite active; no ETOH and no tobacco.  FAMILY HISTORY:  Pertinent for siblings with bowel obstructions, all status post multiple surgeries.  No colon cancer or polyps.  REVIEW OF SYSTEMS:  Review of systems is reviewed and negative other than GI as outlined above.  The patient also says she has a lot of difficulty with her restless leg syndrome being bothersome at night.  PHYSICAL EXAMINATION:  GENERAL:  Well-developed white female in no acute distress.  She is comfortable post medication.  VITAL SIGNS:  Temperature is 97, blood pressure 179/73, pulse in the 70s, respirations 20.  HEENT:  Nontraumatic, normocephalic.  EOMI.  PERLA.  Sclerae anicteric.  CARDIOVASCULAR:  Regular rate and rhythm with S1 and S2.  PULMONARY:  Clear  to A&P.  ABDOMEN:  Soft, tender in the mid-abdomen and left mid-quadrant.  She is slightly distended.  Bowel sounds are hypoactive.  There is no guarding or rebound.  She does have multiple incisional scars.  RECTAL:  Exam is not done.  EXTREMITIES:  Pulses are 2+ and equal.  There is no edema.  IMPRESSION: 1. [age]-year-old white female with recurrent partial small-bowel    obstruction secondary to adhesions with history of multiple admissions for     same. 2. Status post lysis of adhesions x 2 in the 1980s; she is also status post    appendectomy, cholecystectomy, remote perforated diverticulitis in the    1960s and had subtotal colectomy in the 1980s. 3. History of cataracts. 4. Restless leg syndrome. 5. Depression. 6. Gastroesophageal reflux disease.  PLAN:  The patient is admitted to the service of Dr. Judie Petit T. Pleas Koch. for IV fluid hydration, bowel rest, antiemetics and pain control as needed.  If she has recurrent nausea and vomiting, we will place NG tube and plan conservative management unless she does not resolve within the next 24 to 48 hours.  We will check abdominal films in a.m.  For details, please see the orders. Dictated by:   Sammuel Cooper, P.A. Attending Physician:  Starr Sinclair DD:  09/19/01 TD:  09/21/01 Job: [id number]

## 2010-08-02 NOTE — H&P (Signed)
NAMEJEANANN, BALINSKI               ACCOUNT NO.:  [id number]   MEDICAL RECORD NO.:  [id number]          PATIENT TYPE:  INP   LOCATION:  5159                         FACILITY:  [facility]   PHYSICIAN:  Cherylynn Ridges, M.D.    DATE OF BIRTH:  01-06-1927   DATE OF ADMISSION:  11/28/2004  DATE OF DISCHARGE:                                HISTORY & PHYSICAL   IDENTIFICATION/CHIEF COMPLAINT:  The patient is a 77 year old female with a  recurrent small bowel obstruction.   HISTORY OF PRESENT ILLNESS:  The patient has had multiple admissions for  recurrent bowel obstruction.  This current episode started about 7:00  yesterday p.m. and progressed to when she came into the emergency room with  severe nausea, vomiting, dehydration, headaches, and pains.  Her abdominal  pain had gotten worse; however, she has no peritonitis.  She is being  admitted now for NG tube decompression, IV hydration.   PAST MEDICAL HISTORY:  1.  Osteoarthritis.  2.  History of diverticular disease.  3.  Anemia.  4.  Depression.  5.  Open cholecystectomy.  6.  Tubal ligation.   FAMILY HISTORY:  Significant for multiple bowel diseases including Crohn's  and celiac Sprue.   PHYSICAL EXAMINATION:  VITAL SIGNS:  Currently she is afebrile.  Her pulse  is 72, blood pressure 216/96.  HEENT:  She is normocephalic and atraumatic but she looks pale to touch.  She is warm, but has no fever.  NECK:  Supple, no adenopathy.  LUNGS:  Clear.  ABDOMEN:  Only mildly distended, but diffusely mildly tender with hypoactive  bowel sounds.  RECTAL:  Not performed.   LABORATORIES:  White count 18,000 with a left shift, hemoglobin 13.7.  Her  potassium was 5.6, BUN 25, creatinine 1.3.  Amylase was also slightly  elevated at 166.   IMPRESSION:  Recurrent small bowel obstruction.  The patient typically gets  a marked leukocytosis with demargination when she has these attacks with  white counts as high as 26,000.  An 18,000 white blood cell  is not  necessarily indicative of ischemia disease, although there raises some  concern.  She has seen Dr. Jamey Ripa since her last admission in June for this  same problem and he did an outpatient work-up which failed to show a cause  for a  recurrent bowel obstruction.  However, he did suggest to her that if she was  readmitted that a barium enema be done initially followed by a small bowel  follow through.  We will go ahead and take this course in order for barium  enema for tomorrow morning.      Cherylynn Ridges, M.D.  Electronically Signed     JOW/MEDQ  D:  11/28/2004  T:  11/28/2004  Job:  [id number]   cc:   Currie Paris, M.D.  [address]  [place]  [place] [address]

## 2010-08-02 NOTE — H&P (Signed)
[name], [name]               ACCOUNT NO.:  [id number]   MEDICAL RECORD NO.:  [id number]          PATIENT TYPE:  OBV   LOCATION:  1319                         FACILITY:  [facility]   PHYSICIAN:  Lelon Perla, DO    DATE OF BIRTH:  1926-09-17   DATE OF ADMISSION:  09/23/2005  DATE OF DISCHARGE:                              HISTORY & PHYSICAL   ADMISSION DIAGNOSES:  1. Dehydration.  2. Gastroenteritis.   The patient is a 77 year old white female who presents to the office  today with a history of nausea, vomiting, and diarrhea since yesterday  at 5 p.m.  The patient was here yesterday in the office complaining of  some back pain and urinary symptoms.  UA was negative. C&S was sent.  The patient returns today with nausea and vomiting that started last  night about 5:00 p.m. and some diarrhea that has slowed down today.  The  patient was given a soda here in the office after Phenergan IM and  tolerated that well, and was sent for CT of the abdomen secondary to her  history of small bowel obstruction and adhesions.  A phone call from CT  stating that she was vomiting, so it was decided that the patient should  be admitted for intravenous hydration and evaluation for 24 hour  observation.   PAST MEDICAL HISTORY:  1. RLS.  2. Osteoarthritis.  3. Migraines.  4. Post herpetic neuralgia.  5. GERD.  6. Osteoporosis.  7. Diverticulosis.  8. Several admissions for small bowel obstruction.  9. Peptic ulcer disease.  10.Recurrent UTIs.   PAST SURGICAL HISTORY:  1. Partial colectomy secondary to diverticulitis.  2. Cataract surgery.  3. D&C.  4. In June 2006 she had admission for small bowel obstruction and      anemia.  5. Gallbladder surgery.  6. Tubal ligation.  7. Appendectomy.   She currently sees Dr. Juanda Chance or Dr. Corinda Gubler in GI and for surgery she  sees Dr. Jamey Ripa.   ALLERGIES:  No known drug allergies.   MEDICATIONS:  1. Actonel 35 mg weekly.  2. Klonopin 0.5 mg.  3. Celebrex.  4. Prozac.  5. Prilosec 20 mg.  6. Estrogen cream.  7. Flexeril 10 mg at night.  8. Darvocet.  9. Synthroid.  10.Requip.  11.Omeprazole.  12.Vicodin for pain.   Labs done on September 22, 2005, reveal white count of 10.7, hemoglobin 12.2,  hematocrit 36.9, platelet count 223,000.  Eosinophils are 6.3.  Sodium  142, potassium 4.8, chloride 108,  CO2 26, BUN 31, creatinine 1.4,  glucose 86.  TSH was 1.54.  UA was done on July 9th which was negative  and cultures pending.   CT of the abdomen done today showed no obstruction.  A copy of the  result was sent with the patient to the hospital.   PHYSICAL EXAMINATION:  VITAL SIGNS:  Weight 149 pounds, temperature  97.6, blood pressure 130/82, pulse 22.  GENERAL:  She is awake, alert, and oriented, in no acute distress, but  obviously not feeling well.  She is pale.  HEENT:  Pupils equal  and reactive to light.  Tympanic membranes are  intact bilaterally. Oropharynx is clear and moist. Mucous membranes are  moist.  NECK:  Supple with no JVD or bruits.  HEART:  Positive S1 and S2.  LUNGS:  Clear bilaterally.  ABDOMEN:  Soft and nontender with positive bowel sounds.   ASSESSMENT/PLAN:  Nausea, nausea, vomiting, and diarrhea and history of  small bowel obstruction. Admit for observation for IV hydration. Will  repeat labs.  See admit orders.      Lelon Perla, DO     YRL/MEDQ  D:  09/23/2005  T:  09/23/2005  Job:  [id number]

## 2010-08-02 NOTE — Discharge Summary (Signed)
[place]  Patient:    ROYA, GIESELMAN Visit Number: [id number] MRN: [id number]          Service Type: MED Location: 1800 [id number] 01 Attending Physician:  Carmelina Peal Dictated by:   Dianah Field, P.A. Admit Date:  10/08/2001 Disc. Date: 10/07/01   CC:         Ulyess Mort, M.D. [facility]  Rafaela M. Riley Nearing, M.D., [facility],  [address], [place], [place]   Discharge Summary  DATE OF BIRTH:  [birth year]  ADMITTING DIAGNOSES:  1. Abdominal pain with nausea and vomiting, rule out recurrent small-bowel     obstruction, does have biochemical evidence for pancreatitis, so this may     also be the source for the symptoms.  2. Slight abnormality of liver function tests, rule out sludge, rule out     common bile duct stone.  3. Status post remote cholecystectomy.  4. History of recurrent small-bowel obstructions and multiple surgeries and     numerous hospital admissions for medical management of small-bowel     obstruction.  5. Gastroesophageal reflux disease.  6. Hyperglycemia, rule out glucose intolerance/early type 2 diabetes mellitus     versus reactive secondary to pancreatitis.  7. Restless leg syndrome.  8. Depression.  9. Osteoarthritis. 10. Status post right cataract repair with postoperative complications leading     to further surgery. 11. Status post tubal ligation. 12. Status post two separate surgeries in 1995 for lysis of adhesions causing     small-bowel obstruction. 13. Status post subtotal colectomy for complicated diverticulosis in 1993. 14. Status post repair of perforated diverticulum along with appendectomy at     age 39. 68. History of urinary tract infection.  DISCHARGE DIAGNOSES:  1. Resolved small-bowel obstruction.  2. Idiopathic pancreatitis, rule out secondary to sludge versus stones in the     common bile duct.  3. Klebsiella urinary tract infection.  4. Restless leg syndrome.  5.  Anxiety.  6. Anemia, normocytic.   7. Mental status changes, likely secondary to Demerol; this medication     should be avoided in the future.  CONSULTATIONS:  None.  PROCEDURES:  None.  BRIEF HISTORY:  The patient is a 77 year old white female with extensive history of small-bowel obstruction and partial small-bowel obstructions, with numerous admissions for both surgical and medical management for this in the past.  All of her surgeries had been done at [facility] but in the late 1990s, she became a patient of Dr. Ulyess Mort and since then, she has had several admissions for partial small-bowel obstructions, managed medically at [facility].  She had been admitted with partial small-bowel obstruction, July 6th through 8th, at [facility]; per usual, she did not require NG tube placement and the acute abdominal pain with constipation, nausea and vomiting resolved fairly rapidly with bowel rest, analgesics and antiemetics.  She had had a small-bowel series during that admission showing filling of the sigmoid and rectum at 25 minutes.  Only the sigmoid and rectum remained following the subtotal colon resection.  There was a loop of ileum noted in the right lower quadrant which had some mild narrowing in a region of surgical staples; this was felt to be an area of adhesions but was not obstructing.  The patients urinalysis respectively showed some leukocyte esterase, 3 to 6 white blood cells and many bacteria but urine culture was not obtained and she had not been treated for  a UTI.  The patient re-presented on the day of admission with a two- to three-day history of recurrent abdominal pain with nausea and vomiting and lack of bowel movements.  She had been treated in the previous 10 to 12 days by Dr. Genia Del. Riley Nearing for a cough and this may have included antibiotic therapy and certainly some cough medicine, but the specific medicines used were not known, and  she had finished that course of medications by the time she arrived at [facility].  The patient was re-x-rayed and this showed some fluid levels in non-dilated loops of small bowel in the mid-abdomen, possibly representing early or partial small-bowel obstruction versus a small bowel ileus.  It was presumed that she was again having a small-bowel obstruction.  LABORATORY AND ACCESSORY DATA:  Initial white blood cell count 15; it was 8.4 on final assay.  Hemoglobin was initially 14 and down to 11.4 on final check.  Hematocrit ranged from 42 down to 34.1.  MCV was 95.2.  Platelets 203,000.  Initial blood count differential showed a left shift, 86% neutrophils, 7% lymphocytes.  On recheck, the differential was essentially within normal limits with the exception of a high absolute monocyte count at 1.  PT was 12.6, INR 0.9, PTT of 28.  Sodium 135, potassium 4.9, chloride 105, CO2 of 19.  Glucose ranged anywhere from 144 down to 106 near the end of her hospital stay.  BUN initially elevated at 37 and it was 16 at discharge. Creatinine ranged 1.6 on admission, 0.9 at discharge.  Calcium was 7.7, total protein 5.4, albumin 2.5 to 2.6.  AST maximum 145, ALT maximum 175, alkaline phosphatase maximum 167, total bilirubin maximum 2.0.  At discharge, the total bilirubin was 0.5, alkaline phosphatase 93, AST 21 and ALT of 48.  Amylase maximum 503, corrected to 113.  Lipase maximum 182, corrected to 16. Urinalysis was positive for nitrites and trace leukocyte esterase.  There were 0 to 2 white blood cells, no red blood cells.  Bacteria were too numerous to count.  Urine culture grew out greater than 100,000 Klebsiella pneumoniae, which was sensitive to multiple medications including ciprofloxacin.  Radiology studies:  Acute abdominal series with chest, September 30, 2001, showed no active cardiopulmonary disease.  Fluid level seen in nondilated small bowel  in the mid-abdomen possibly representing  early/partial small-bowel obstruction versus a developing small bowel ileus.  Single-view abdomen of October 02, 2001 showed a single, minimally dilated loop of small bowel in the right mid-abdomen consistent with improving small-bowel obstruction.  Single-view abdomen of July 20th showed resolution of small-bowel obstruction.  Single-view abdomen of October 04, 2001 showed continued resolution of small-bowel obstruction.  Ultrasound of the abdomen performed on July 18th showed moderate dilation of common bile duct, more than typically seen in post-cholecystectomy patient. Distal duct nonvisible, cannot rule out choledocholithiasis.  Pancreas poorly seen due to overlying intestinal bowel gas.  Recommend CT or MRI for further evaluation of pancreas and biliary system if warranted.  HOSPITAL COURSE:  The patient was admitted to non-telemetry bed.  Initially, she was not placed on any NG tube suction.  On review of her labs, it became apparent that besides the small-bowel obstruction, she also had pancreatitis. The amylase and lipase were elevated, along with some elevation of her LFTs. Therapy for both obstruction and the pancreatitis were the same and included bowel rest, IV fluids, analgesics, antiemetics.  Eventually, she did require placement of an NG tube for a couple of days.  Output from this was elevated initially with output on the 20th of 3400 cc and about 1300 cc on the 21st. Thereafter, output decreased for a total of 900 cc on the 22nd of July; thereafter it was clamped.  She tolerated clamping of the NG tube without further problems with increased abdominal pain or nausea and it was pulled on the 23rd.   The patient had not had a bowel movement since July 15th, prior to coming to the hospital.  She did not have any further bowel movements until the 23rd of July, at which point she started to have multiple loose stools.  Loose stools continued through the 24th.  She was,  however, reaching her baseline, which is at least four loose stools daily; in fact, she gets worried if she has less than three bowel movements a day, concerned that she may be developing a bowel obstruction.  Towards the end of hospitalization, she did have some cramping and abdominal discomfort associated with her stools; this is not unusual for her.  Once p.o.s were restarted on the 23rd, she initially tolerated clear liquids and then a low-residue diet without problems.  Regarding the pancreatitis, her lipase and amylase resolved quickly; she did, however, undergo ultrasound imaging.  The etiology of her pancreatitis is not clear.  Plan is for her to have a CT scan as an outpatient prior to her return visit to see Dr. Ulyess Mort.  The patient has had urinary tract infections noted on prior admissions.  She grew out significant numbers of Klebsiella on urine culture during this admission and was treated initially with IV Cipro, then oral ciprofloxacin. She will need to continue oral Cipro for another five days.  It is not clear whether she develops the urinary tract infections as sequela of dehydration associated with small-bowel obstruction, and in this case pancreatitis.  It is possible that the urinary tract infections may be more of a chronic problem, therefore, she was advised to have her urine rechecked in approximately two weeks, which would represent at least one week following completion of antibiotic therapy.  She can have a urinalysis at her primary care physicians office, Dr. Riley Nearing, who will receive a copy of this discharge summary, but this was also recommended and written down on the patients discharge instruction sheets.  Regarding imaging of the pancreas, she underwent an ultrasound.  Findings are noted above.  There was some moderate prominence of the common bile duct, maybe a little bit more prominent than is typically seen in a post-cholecystectomy patient.   Distal duct was not visible and therefore, choledocholithiasis could not be entirely excluded.  The pancreas itself was poorly visualized because of the bowel obstruction.  A CT scan of the abdomen and pelvis was not pursued; plan is to pursue this as an outpatient for sake of completeness, and this will be performed prior to her returning to see Dr. Victorino Dike in the office.  The patient was clinically much improved and tolerating low-residue diet near the time of discharge.  Bowels were moving well, if not a little bit too much. She was halfway through completion of antibiotic therapy for UTI and was deemed stable for discharge to home.  During the course of the hospitalization, the patient had acute confusion and pulled out her NG tube and was found walking confused in the hallway.  At that point, medications that she had received recently included Demerol.  It was felt that this may be the source for the  confusion, so this was discontinued and Dilaudid was substituted.  Morphine does not cause confusion but had not been effective for controlling pain in this patient in the past and therefore was not used.  FOLLOWUP:  She is to follow up with Dr. Victorino Dike at a time to be appointed and confirmed with her; I do not have this time right now.  She is to make an appointment with Dr. Gorden Harms office, at which time she should have a urine rechecked; she does not necessarily need to see the physician, however, just have a urinalysis on a clean-catch specimen.  MEDICATIONS AT DISCHARGE:  1. Prilosec 20 mg p.o. q.d.  2. Zyrtec 10 mg p.o. q.d.  3. Flexeril 10 mg p.o. at h.s.  4. Celebrex 200 mg p.o. q.d.  5. Fosamax 75 mg one p.o. weekly.  6. Darvocet-N 100 p.r.n.  7. Phenergan 25 mg p.r.n.  8. Requip 1 mg p.o. q.i.d.  9. Klonopin 0.5 mg p.o. at h.s. 10. Ciprofloxacin 500 mg p.o. b.i.d. for five more days. Dictated by:   Dianah Field, P.A. Attending Physician:  Carmelina Peal DD:  10/07/01 TD:  10/08/01 Job: [id number]

## 2010-08-02 NOTE — H&P (Signed)
[name], Janice NO.:  Miller   MEDICAL RECORD NO.:  [id number]                   PATIENT TYPE:  EMS   LOCATION:  MAJO                                 FACILITY:  [facility]   PHYSICIAN:  Corwin Levins, M.D. [facility]             DATE OF BIRTH:  [birth year]   DATE OF ADMISSION:  04/10/2003  DATE OF DISCHARGE:                                HISTORY & PHYSICAL   CHIEF COMPLAINT:  Abdominal pain, worsening for the last 2-3 days with  nausea.   HISTORY OF PRESENT ILLNESS:  [name] is a 77 year old white female with  acute onset of pain diffuse to the abdomen with nausea despite relatively  good bowel movement and nausea medications p.o. and a suppository at home.  She also took some morphine she has at home p.r.n. and thinks it made her  more nauseous.  She did not have any vomiting or fever.  There are no other  specific GU symptoms at this time.   PAST MEDICAL HISTORY:   ILLNESSES:  1. History of recurrent small-bowel obstruction, status post adhesiolysis x2     in 1995.  2. History of diverticulosis/diverticulitis, status post repair of a     perforated diverticulum at 77 years old and status post subtotal     colectomy in 1993.  3. GERD.  4. History of herpetic esophagitis July 2003.  5. History of idiopathic pancreatitis July 2003.  6. History of ongoing recurrent bowel incontinence related to the above     problem.  7. DJD.  8. Restless-leg syndrome.  9. Anxiety/depression.  10.      Osteopenia.   SURGERY:  1. Status post cholecystectomy.  2. Status post right cataract.  3. Status post BTL.   ALLERGIES:  DEMEROL.   CURRENT MEDICATIONS:  1. Prozac 40 mg p.o. daily.  2. Cyclobenzaprine 10 mg daily.  3. Zyrtec 10 mg p.o. daily.  4. Klonopin 0.5 mg daily.  5. Celebrex 200 mg p.o. daily.  6. Prilosec 20 mg daily.  7. Requip 1 mg, 5 tablets per day taken 1, 1, 1, and 2 q.h.s.  8. Darvocet p.r.n.  9. Elavil 50 mg q.h.s. for postherpetic  neuralgia-type pain.  10.      Phenergan p.r.n.  11.      Vicodin p.r.n.  12.      Actonel 35 mg weekly.   SOCIAL HISTORY:  Currently a widow.  Her son is a Clinical research associate in [employer].  No  tobacco, no alcohol.   FAMILY HISTORY:  Daughter with celiac disease.  Sister with Crohn's disease.  Three other sisters with breast cancer.   REVIEW OF SYSTEMS:  Noncontributory essentially except for the deep cramps  this morning.   PHYSICAL EXAM:  VITAL SIGNS:  Temperature 99, 142/74, respirations 20, pulse  72.  HEENT:  Sclerae clear.  TMs clear.  Pharynx benign.  NECK:  Without lymphadenopathy, JVD,  thyromegaly.  CHEST:  No rales or wheezing.  CARDIAC:  Regular rate and rhythm.  ABDOMEN:  After several Dilaudid in the ER, soft, increased bowel sounds,  nondistended.  No organomegaly noted.  EXTREMITIES:  No edema.   LABORATORIES:  Abdomen films:  No definite obstruction.   UA with 21-50 white blood cells, 0-2 red blood cells.  There is positive  leukocyte esterase and positive nitrite.  Lipase 27.  The pH is 7.4, pCO2  33.  Hemoglobin 14.3, white blood cell count 15.7, _____________ 13.2.  Glucose 198, sodium 135, potassium 4.9, chloride 108, bicarbonate 21, BUN  22, creatinine 0.9.   ASSESSMENT AND PLAN:  1. Abdominal pain:  Multifactorial, probably urinary tract infection/ileus,     but cannot rule out small-bowel obstruction recurrent versus     diverticulitis versus other.  She is to be admitted, given IV Cipro and     Flagyl, pain medications for symptomatic control as needed.  Consider NG.     For now, consult GI and general surgery.  2. Urinary tract infection:  Treatment as above.  Check urine culture.  3. Hyperglycemia:  Check CBGs.  Sliding scale insulin.  4. Other medical problems:  Continue home medications as is.  5. Disposition:  Hopefully to home when better.                                                Corwin Levins, M.D. [facility]    JWJ/MEDQ  D:  04/10/2003  T:   04/10/2003  Job:  [id number]   cc:   Angelena Sole, M.D. [facility]   Ulyess Mort, M.D. [facility]   Currie Paris, M.D.  [address]  [place]  [place] [address]  Fax: [phone]

## 2010-08-02 NOTE — Discharge Summary (Signed)
[place]  Patient:    [name], [name] Visit Number: [id number] MRN: [id number]          Service Type: MED Location: [phone] Attending Physician:  Estella Husk Dictated by:   Dianah Field, P.A. Admit Date:  01/31/2001 Discharge Date: 02/04/2001                             Discharge Summary  ADMITTING DIAGNOSES:  1. Recurrent partial small bowel obstruction in patient with history of     multiple partials and complete small bowel obstructions for which she has     undergone surgery on two occasions at [facility].  She has also     had multiple admissions for medical management of small bowel obstructions     both at [facility] and at [facility].  2. Leukocytosis, rule out reactive, rule out urinary tract infection.  3. History of diverticulitis, status post surgical repair of perforated     diverticulum along with appendectomy at age 72.  4. Status post subtotal colectomy for complicated diverticulosis in 1993.  5. Status post laparoscopic tubal ligation.  6. Status post open cholecystectomy.  7. Status post right cataract repair with subsequent ocular surgery due to     postoperative complications.  8. Osteoarthritis.  9. Restless leg syndrome. 10. Depression.  DISCHARGE DIAGNOSIS:  Partial small bowel obstruction versus constipation versus urinary tract infection, symptoms resolved.  CONSULTATIONS:  None.  PROCEDURES:  None.  HISTORY OF PRESENT ILLNESS:  [name] is a 77 year old, white female who has above-noted history of multiple abdominal surgeries from diverticulitis and small bowel obstructions.  She is known to [facility] service because she has been admitted on a couple of occasions within the past year for conservative treatment of partial small bowel obstructions.  She does not always require NG suction for her presentations with acute abdominal pain, nausea and vomiting. The  patient presented to the emergency room on January 31, 2001.  She had a brief episode of severe abdominal pain with nausea and vomiting about two weeks prior, but these resolved within 24 hours.  On the afternoon prior to admission, a couple hours after lunch, she began experiencing acute diffuse abdominal pain.  She had had a bowel movement that morning.  With the abdominal pain, she began throwing up and threw up a total of 12-15 times. She did not have any hematemesis or coffee-grounds material.  Pain continued through the night despite Vicodin therapy.  Nausea continued, although the vomiting abated to some extent.  Phenergan suppositories were only minimally helpful for control of nausea.  In the emergency room, she had an acute abdominal series that showed air fluid levels in a nondilated small bowel and stool within the distal colon.  This was read as possible enteritis versus mild ileus.  A white blood cell count was 15.7.  Urinalysis was significant for contamination with multiple epithelials, but also many bacteria and no nitrites or leukocyte esterase.  Dr. Marina Miller evaluated the patient and admitted her to [facility].  LABORATORY DATA AND X-RAY FINDINGS:  Initial white blood cell count 15.7, down to 9.7 on final assay; hemoglobin ranged 12.1 to 13.3; MCV 94.1; platelets ranged 226 to 301.  On differential, there was a left shift present.  Sodium 135, potassium 4.6, glucose 123, BUN 17, chloride 103, CO2 24.  Albumin 3.7.  Total bilirubin 0.8, Alk phos 54, AST 23, ALT 13.  Initial acute abdominal series showed scattered air fluid levels in nondilated small bowel similar to appearance of abdominal series on June 04, 2000. Obstruction was less likely given the lack of distention in the bowel. However, enteritis as well as mild ileus could give that appearance.  Followup KUB of November 18, showed early or partial small bowel obstruction.  HOSPITAL COURSE:  The patient  was admitted to a nontelemetry bed.  She was made NPO over the exception of necessary oral medications.  Arrangements were made to get a clean catch urine because of the equivocal nature of the urine showing both significant bacteria and epithelial cells.  Over the course of the next 24 hours, the patient had ongoing diffuse abdominal pain, but this was well-controlled with morphine.  She had not had any bowel movements. Within this next 24 hours, by day #3 of her hospitalization, she had had a large bowel movement following administration of an enema and several hours later a watery bowel movement.  The pain was beginning to improve, but was not entirely eradicated.  She was not requiring nearly as much morphine for control of pain.  She was not having any nausea.  Clear liquids were started.  The patient had trouble following instructions to get a clean catch urine.  On at least one occasion, the clean catch urine was not an adequate specimen. Finally, we did get an adequate specimen, but by the time the results were available which showed significant growth of E. coli at greater than 100,000 colonies per high power field, the patient had been discharged.  At that time, she was called and told to proceed to her local physician and have him recheck her urine for culture and treat her if it was still necessary as this was several days after she had been discharged.  In the interim, she had gone on vacation.  In any event, the patients bowel pain, nausea and vomiting resolved and she was felt stable for discharge on November 21.  She had tolerated further advancements of her diet to solid foods.  She was continuing to have bowel movements and was not having any abdominal pain.  DISCHARGE MEDICATIONS:  1. Prilosec 20 mg p.o. q.d.  2. Zyrtec 10 mg p.o. q.d.  3. Flexeril 10 mg p.o. at h.s.  4. Clonazepam 0.5 mg p.o. t.i.d.  5. Requip 1 mg p.o. t.i.d.  6. Premarin 1.25 mg p.o. q.d.  This  is in the process of being tapered for     ultimate discontinuation.  7. Provera 2.5 mg p.o. q.d. in a tapering dose with ultimate plan for      discontinuation.  8. Celebrex 200 mg p.o. q.d.  9. Fosamax 75 mg p.o. each week. 10. Darvocet-N 100 p.r.n. 11. Oxycodone p.r.n. 12. Phenergan suppository 25 mg per rectum p.r.n. 13. Neurontin 300 mg p.o. q.h.s. 14. Amitriptyline 50 mg p.o. q.h.s.  ACTIVITY:  Limited for the next few days with no strenuous exercise and bed rest as needed for fatigue.  DIET:  Soft diet for the next few days and then resume her regular diet.  FOLLOWUP:  She was to see Dr. Victorino Dike on December 20, at 2 p.m. and to call if there were any problems before that date. Dictated by:   Dianah Field, P.A. Attending Physician:  Estella Husk DD:  04/15/01 TD:  04/16/01 Job: [id number]

## 2010-08-02 NOTE — Discharge Summary (Signed)
[name], [name]               ACCOUNT NO.:  [id number]   MEDICAL RECORD NO.:  [id number]          PATIENT TYPE:  INP   LOCATION:  1610                         FACILITY:  [facility]   PHYSICIAN:  Rachael Fee, M.D. DATE OF BIRTH:  [birth year]   DATE OF ADMISSION:  12/16/2004  DATE OF DISCHARGE:  12/20/2004                                 DISCHARGE SUMMARY   ADMITTING DIAGNOSES:  27.  A 77 year old female with probable recurrent small-bowel obstruction      secondary to adhesions, three prior admissions this year with similar      symptoms.  2.  Status post subtotal colectomy remotely for complicated diverticular      disease.  She is also status post cholecystectomy and laparotomy with      lysis of adhesions x 2.  3.  Gastroesophageal reflux disease.  4.  Depression.  5.  Arthritis.  6.  Osteoporosis.  7.  History of idiopathic pancreatitis, 2003.  8.  Recurrent urinary tract infections, on suppressive therapy.  9.  Restless leg syndrome.   DISCHARGE DIAGNOSES:  1.  Resolved transient partial small-bowel obstruction.  2.  Possible urinary tract infection, with history of recurrent urinary      tract infections, on suppressive therapy.  3.  Other diagnoses as listed above.   CONSULTATIONS:  Dr. Jamey Ripa, surgery.   PROCEDURES:  Flexible sigmoidoscopy, Dr. Christella Hartigan.  Plain abdominal films.   BRIEF HISTORY:  Litzy is a pleasant 77 year old white female well known to  the GI service with multiple admissions over the years for recurrent small-  bowel obstructions.  She has a surgical history as outlined above.  She also  has a history of recurrent urinary tract infections and is on suppressive  therapy chronically.  She did have an episode of pancreatitis, felt to be  idiopathic, several years ago.  She was last hospitalized in September 2006  at [facility] with recurrent small-bowel obstruction.  She was seen by  surgery during that admission and was to follow up with Dr.  Daphine Deutscher regarding  lysis of adhesions later this week.  She had undergone cardiac workup during  that admission due to some complaints of chest tightness, and that workup  was negative.  A barium enema was done in September 2006, showing no  significant small-bowel distension.  CT done in June 2006 did show evidence  of a mid small-bowel obstruction with dilated small-bowel and air/fluid  levels in the jejunum, suggesting a mid small-bowel obstruction.  She says  that she had done well after her most recent discharge until the day prior  to this admission when she began with recurrent abdominal pain and then  eventually nausea and vomiting.  She said she vomited up a large amount of  material prior to coming to the emergency room and is feeling a bit better  at this time.  She is admitted for pain control, antiemetics and further  diagnostic evaluation for what appears to be another episode of recurrent  partial small-bowel obstruction.   LABORATORY STUDIES:  On admission, October 2:  WBC of 21,000,  hemoglobin  14.8, hematocrit of 44.1, MCV of 98, platelets 299.  Followup on October 5  shows WBC of 8.6, hemoglobin 10.6, hematocrit of 31.7.  Pro time 12.8, INR  of 0.9.  Electrolytes within normal limits.  BUN 37, creatinine 2.1.  Albumin 4.4.  Followup on October 5, BUN 17, creatinine 1.1.  Liver function  studies normal.  Amylase on October 3 was 198, lipase of 25.  Urine culture,  which was ordered on the 4th and pending at the time of discharge, shows  100,000 E. coli which is resistant to Cipro and ampicillin.  Abdominal films  on October 3 showed no plain-film evidence of bowel obstruction, nonspecific  gas pattern with air/fluid level likely in the sigmoid colon.  Abdominal  films on October 2 negative.   HOSPITAL COURSE:  The patient was admitted to the service of Dr. Wendall Papa  through the emergency room.  She was initially kept n.p.o.  An NG tube was  placed to low Gomco.  She  was given Phenergan for nausea and morphine as  needed for control of pain and continued on her other usual medications.  She had a benign hospital course.  The following day, she was feeling  better.  Did have a bowel movement, and her NG was clamped.  We also  consulted surgery at that time.  She was seen by Dr. Jamey Ripa then on the 4th.  He was not convinced that she had actually had an episode of obstruction  this admission and suggested it may be useful to look at her anastomosis to  make sure that she did not have a stricture, etc., contributing to these  episodes.  She was then scheduled for sigmoidoscopy, done on the 6th per Dr.  Christella Hartigan,  showing the anastomosis to be patent and a tortuous, but otherwise  normal, small bowel proximal to the enterocolic anastomosis.  In the  interim, we were able to discontinue her NG tube and gradually advance her  diet, which she tolerated without difficulty.  She had no symptoms referable  to a urinary tract infection; however, a culture had been sent on the 4th,  which was pending at the time of discharge and has since returned showing  100,000 E. coli resistant to ampicillin and Cipro.  The patient was allowed  discharge to home on October 6 after undergoing a sigmoidoscopy.  She was to  follow up with Dr. Jamey Ripa in his office in several weeks and to follow up  with Dr. Victorino Dike on a p.r.n. basis.  Dr. Jamey Ripa was giving consideration  to exploratory LAP but was not convinced that this would prevent her  recurrent episodes.   MEDICATIONS ON DISCHARGE:  The patient was asked to decrease her Darvocet  use and to try to wean herself off of this over the next month or so.  We  increased her clonazepam to 0.75-1 mg at bedtime as she had been using  Darvocet primarily to help herself sleep with her restless leg syndrome.  She was given a new prescription for Vicodin to use on a p.r.n. basis for severe episodes of abdominal pain.  Other medications  per her usual schedule  to include:  1.  Actonel 35 mg weekly.  2.  Zyrtec 10 mg daily.  3.  Celebrex 200 mg daily.  4.  Prozac 40 mg daily.  5.  Requip 1 mg at breakfast, lunch and dinner and 2 mg at bedtime.  6.  Prilosec 10 mg  daily.  7.  Estrogen cream 0.1% vaginally 3 times weekly.  8.  Flexeril 10 mg q.h.s.  9.  Synthroid 25 mcg daily.   CONDITION ON DISCHARGE:  Stable and improved.      Mike Gip, P.A.-C. [facility]    ______________________________  Rachael Fee, M.D.    AE/MEDQ  D:  12/26/2004  T:  12/26/2004  Job:  [id number]   cc:   Currie Paris, M.D.  [address], [address]  [place]  [place] [address]

## 2010-08-02 NOTE — Discharge Summary (Signed)
[name], BEAVERS               ACCOUNT NO.:  [id number]   MEDICAL RECORD NO.:  [id number]          PATIENT TYPE:  INP   LOCATION:  5707                         FACILITY:  [facility]   PHYSICIAN:  Rene Paci, M.D. LHCDATE OF BIRTH:  [birth year]   DATE OF ADMISSION:  09/02/2004  DATE OF DISCHARGE:  09/05/2004                                 DISCHARGE SUMMARY   DISCHARGE DIAGNOSES:  1.  Small bowel obstruction.  2.  Acute renal insufficiency.  3.  Tachycardia secondary to dehydration.  4.  Hypotension secondary to dehydration.  5.  Anemia.   HISTORY OF PRESENT ILLNESS:  The patient is a 77 year old female who has had  several admissions in the past for small bowel obstruction who presented to  the ER with a 24-hour history of nausea and vomiting with associated  abdominal pain.  The patient was unresponsive to outpatient management.  At  the time of admission, she denied dysuria, fever, chills, or diarrhea.  The  patient had a CT in the ER which revealed a small bowel obstruction without  critical dilation per radiology.  The patient had an elevated white count of  23,000 on admission.   PAST MEDICAL HISTORY:  1.  Restless leg syndrome.  2.  Multiple small bowel obstructions.  3.  Status post perforated diverticulitis with colectomy in 1960.  4.  Chronic low back pain.  5.  Chronic urinary tract infections.  6.  Osteoporosis.  7.  Seasonal allergies.  8.  Hypothyroidism.   COURSE OF HOSPITALIZATION:  1.  Small bowel obstruction.  The patient was admitted and underwent a CT of      the abdomen and pelvis which showed marked biliary dilation similar to a      previous study performed.  In addition, dilated small bowel was noted      with a mid small bowel obstruction, as well as constipation with      increased stool on the left in sigmoid colon.  That was performed on      September 02, 2004.  On September 03, 2004, the patient underwent a KUB which      showed continued mild  distention of the mid abdominal small bowel loop.      The patient was treated with anti-emetics and made NPO.  A surgical      consult was obtained.  No surgical intervention was necessary during      this hospitalization.  2.  Acute renal insufficiency.  At the time of admission, the patient's      creatinine was 1.2, which bumped up to 2.1 on her second day of      admission.  The patient was given IV hydration, and creatinine returned      to 1.2, which is the patient's baseline.  3.  Tachycardia.  The patient was noted to have tachycardia, most likely      secondary to nausea, vomiting, and associated dehydration.  The patient      was given IV fluids and tachycardia resolved.  4.  Hypothyroidism.  The patient was noted to have  a TSH of 7.390.  The      patient had a history of hypothyroidism in the past, but had not been      treated with medications for many years.  A free T4 and T3 were also      sent to differentiate between sick euthyroid versus true hypothyroidism.      T4 was 0.68, and T3 was 2.0, both of which were low.  Plan to start the      patient on a small dose of thyroid medication.  5.  Anemia.  The patient was noted to have dark-colored stools.  Stools were      sent for fecal occult blood, which was negative.  The patient will need      further evaluation of anemia as an outpatient.   MEDICATIONS AT DISCHARGE:  1.  Actonel 35 mg once weekly.  2.  Clonazepam 0.5 mg p.r.n.  3.  Zyrtec 10 mg p.o. daily.  4.  Celebrex 200 mg p.o. daily.  5.  Prozac 40 mg p.o. daily.  6.  Requip 1 mg p.o. daily.  7.  Prilosec 20 mg p.o. daily.  8.  Estrogen cream 0.01% three times weekly vaginally.  9.  Flexeril 10 mg at bedtime.  10. Darvocet one tablet four times daily as needed.  11. Trimethoprim 100 mg daily.  12. Synthroid 25 mcg daily.  (Prescriptions were given to patient for Actonel and Synthroid, one month  supply.).   LABORATORIES AT DISCHARGE:  Hemoglobin 9.2,  hematocrit 27.7, BUN 16,  creatinine 1.2.   FOLLOW UP:  The patient is to follow up with Dr. Jamey Ripa on October 07, 2004 at  9:15 a.m., and to follow up with Dr. Laury Axon on September 23, 2004 at 10:30 a.m.       MSO/MEDQ  D:  09/05/2004  T:  09/05/2004  Job:  [id number]   cc:   Dr. Laury Axon

## 2010-08-02 NOTE — H&P (Signed)
[place]. [facility]  Patient:    [name], [name] Visit Number: [id number] MRN: [id number]          Service Type: MED Location: [phone] Attending Physician:  Judeth Cornfield Dictated by:   Dianah Field, P.A. Admit Date:  09/30/2001                           History and Physical  PRIMARY CARE PHYSICIAN:  Dr. Bernadette Hoit in [facility].  CHIEF COMPLAINT:  Recurrent abdominal pain with nausea and vomiting.  HISTORY OF PRESENT ILLNESS:  The patient is a 77 year old white female with extensive history of partial small bowel obstruction requiring numerous admissions over the past several years.  She has undergone surgery in [facility] on two occasions for complete small bowel obstructions.  However, most of her admissions were for medical management of small bowel obstructions which cleared.  Some of these were at [facility], but approximately in the late 1990s the patient became a GI patient of Dr. Victorino Dike at which point hospital GI care started to be provided through the [facility] system.  The patient was just discharged from an admission dated July 6 through July 8 with a recurrent partial small bowel obstruction.  Per the usual, she did not require placement of an NG tube.  She had presented with acute abdominal pain mostly in the left upper quadrant with nausea and vomiting and lack of stool output.  The patient improved with bowel rest, analgesics, and antiemetics, and was discharged to home in stable condition on hospital day #3.  She underwent small bowel series while admitted.  This showed overall improving small bowel gas pattern on the scout film.  There was filling into the sigmoid colon and rectum at 25 minutes.  They note that the patient had a history of subtotal colon resection with only the sigmoid and rectum remaining.  There was a loop of ileum in the right lower quadrant where there was some  mild narrowing in a region of surgical staples.  This was felt to be secondary to adhesions but no specific obstruction was called.  The patients white blood cell count had been 12.8 and normalized to 9.  On urinalysis she had a small amount of leukocyte esterase, 3-6 white blood cells, and many bacteria.  No urine culture was obtained.  Since her discharge she initially did well, though she did have some yellow sputum - not so much coughing it up as clearing it from her throat.  She was placed on some antibiotics by Dr. Riley Nearing along with possibly some cough medicine, but she did not bring these medicines with her today.  In any event, she had finished the antibiotic.  She denied fevers.  About three days prior to this admission the patient developed recurrent left upper quadrant pain with nausea.  Was not vomiting a lot and when she did, it was nonbloody and clear.  She developed anorexia.  Her last stool had been on July 15 and it was watery rather than having any solid material.  Symptoms did not improve so she contacted the [facility] office and was advised to come to the emergency room. Dr. Arlyce Dice saw her there and an acute abdominal series showed fluid levels in nondilated small bowel in the midabdomen, probably representing early or partial small bowel obstruction versus a developed small bowel ileus. Labs revealed a leukocytosis with white  cell count of 15.  BUN was elevated at 37 as was her glucose level.  Amylase level was 452.  PAST MEDICAL HISTORY:  1. History of multiple small bowel obstructions owing to multiple prior     abdominopelvic surgeries.  2. History of diverticulitis.  At age 42 had a surgical repair of perforated     diverticulum along with appendectomy.  3. Status post subtotal colectomy for complicated diverticulosis in 1993.  4. Status post two separate surgeries in 1995 for lysis of adhesions which     had led to small bowel obstruction.  5. Status post  tubal ligation.  6. Status post open cholecystectomy.  7. Status post right cataract repair which required further surgery     secondary to complications.  8. Osteoarthritis.  9. Restless leg syndrome. 10. Depression.  SOCIAL HISTORY:  The patient is widowed.  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  A son is a Clinical research associate in [employer].  She is pretty active and walks up to three miles many days out of the week.  She does not smoke or drink.  FAMILY HISTORY:  A sister who has a history of Crohns disease which has required surgery for either the Crohns or for diverticulosis.  Other siblings have had diverticulosis and diverticulitis.  Three of her sisters have had breast cancer.  ALLERGIES:  No known drug allergies.  CURRENT MEDICATIONS:  1. Prilosec 20 mg p.o. q.d.  2. Zyrtec 10 mg p.o. q.d.  3. Flexeril 10 mg p.o. q.h.s.  4. Celebrex 200 mg p.o. q.d.  5. Fosamax 75 mg p.o. every week.  6. Darvocet-N 100 p.r.n.  7. Phenergan 25 mg p.o. p.r.n.  8. Requip 1 mg p.o. q.d.  9. Klonopin 0.5 mg p.o. q.h.s.  REVIEW OF SYSTEMS:  NEUROLOGIC:  She has the restless legs syndrome.  She has difficulty sleeping.  Occasional headaches.  No focal weakness.  PULMONARY: Is clearing up some yellowish-looking material from her throat but really no active cough.  No shortness of breath.  CARDIOVASCULAR:  No chest pain, no palpitations.  GI:  As above.  GU:  Does not have any dysuria, frequency, or nocturia.  Has not seen any blood in her urine.  MUSCULOSKELETAL:  No myalgias but she does complain of stiffness and pain in her hands and legs.  PHYSICAL EXAMINATION:  GENERAL:  The patient is a trim, well-preserved 77 year old white female.  She is anxious.  She is in no acute distress.  She is alert and oriented x3.  HEENT:  Sclerae nonicteric, conjunctivae pink.  Extraocular movements intact. Oropharynx is moist and clear.  No bleeding.  Dentition in good repair.  NECK:  There  is no JVD, masses, or thyromegaly.  CARDIAC:  There is regular rate and rhythm; no murmurs, rubs, or gallops.  S1, S2 audible.   PULMONARY:  A few fine rales at the right base.  No cough with deep breathing. No wheezing.  ABDOMEN:  Nondistended, soft, with active bowel sounds.  There is diffuse tenderness which is greater in the left upper quadrant but no guarding or rebound associated with this.  No hepatosplenomegaly or masses.  RECTAL, BREAST, AND GENITOURINARY:  Deferred.  EXTREMITIES:  Ankles and feet are thin, without edema.  Feet are warm and dry.  NEUROLOGIC:  No tremor.  Able to walk without assistance though she is moving somewhat slowly.  LABORATORY DATA:  White blood cell count 15, hemoglobin 14, hematocrit 42, MCV 95.9,  platelets 221.  On differential there is a left shift with 86% neutrophils, 7% lymphocytes, absolute neutrophil count is elevated.  PT 12.6, INR 0.9, PTT 28.  Sodium 135, potassium 4.9, glucose 143, BUN 37, creatinine 1.6, total bilirubin 1.0, alkaline phosphatase 100, AST 69, ALT 76, albumin 4.1, amylase 452 - on recheck is 503, and lipase 182.  IMPRESSION: 1. Abdominal pain with nausea and vomiting.  Unlike her usual right-sided    abdominal pain, this pain is more predominantly in the left upper quadrant    and she has biochemical evidence for pancreatitis, including elevated    lipase and amylase. 2. Slight abnormality of her liver function tests.  Rule out biliary disease. 3. Status post remote cholecystectomy. 4. History of recurrent small bowel obstructions. 5. Hyperglycemia, not requiring intervention at this point. 6. Restless legs syndrome.  PLAN: 1. The patient admitted to Dr. Nita Sells service. 2. She will undergo ultrasound of the abdomen today to assess the biliary    ductal system. 3. Follow up abnormal labs with serial labs q.d. 4. Limit p.o.s to her medications with sips for the time being and continue    her on IV fluids with  supportive analgesics and antiemetics as needed.    Will continue any essential oral medications but hold off on some of the    more optional medications that she takes; that would be Fosamax, Celebrex. Dictated by:   Dianah Field, P.A. Attending Physician:  Judeth Cornfield DD:  10/01/01 TD:  10/01/01 Job: [id number]

## 2010-08-02 NOTE — Consult Note (Signed)
[place]. [facility]  Patient:    [name], [name] Visit Number: [id number] MRN: [id number]          Service Type: MED Location: [phone] Attending Physician:  Mervin Hack Dictated by:   Jimmye Norman, M.D. Proc. Date: 10/08/01 Admit Date:  10/08/2001   CC:         [facility] -- Teaching Service   Consultation Report  REASON FOR CONSULTATION:  The patient is a 77 year old woman with recurrent partial small-bowel obstruction.  She was recently discharged from an admission for a similar problem yesterday and came back in today and was starting to have nausea and vomiting again today.  She has got restless limb syndrome also for which she takes Requip, which complicates the matter because she cannot take her p.o. medications for Parkinsons disease.  ASSESSMENT AND PLAN:  Her abdominal films do not show a very high-grade bowel obstruction, with multiple air-fluid levels and dilated small bowel loops; it actually is minimally diagnostic for a partial small-bowel obstruction, however, her history of nausea and vomiting and a previous history of multiple abdominal operations, up to six, would make you suspicious for this problem. My plan would be to not operate on the patient, let her take ice chips and water and if this does not resolve, then perhaps consider an upper gastrointestinal series and small-bowel follow-through.  Currently, however, I do not expect that she will require surgical intervention. Dictated by:   Jimmye Norman, M.D. Attending Physician:  Mervin Hack DD:  10/08/01 TD:  10/12/01 Job: [id number]

## 2010-08-02 NOTE — H&P (Signed)
[place]. [facility]  Patient:    [name], [name]                     MRN: [id number] Adm. Date:  06/04/00 Attending:  Wilhemina Bonito. Eda Keys., M.D. [facility] Dictator:   Dianah Field, P.A.                         History and Physical  CHIEF COMPLAINT:  Acute abdominal pain with nausea and vomiting.  HISTORY OF PRESENT ILLNESS:  [name] is a nice 77 year old white female with a history of multiple abdominal surgeries and complicated diverticulitis as well as small bowel obstructions.  Two weeks ago, she had abdominal pain with nausea and vomiting.  She was seen by her primary physician, Rafaela M. Aguiar (SP ?) in [place].  She was provided with a prescription for Flagyl and Cipro for what the doctor felt might be diverticulitis.  However, she did not have any imaging studies and it is unclear as to whether she had any blood work at that time.  She was not having any fever, chills or septic type symptoms.  The patient did not fill the antibiotic prescription because she was pretty certain that this is not diverticulitis but a recurrent small bowel obstruction.  She has had multiple bowel obstructions.  She has had two surgeries previously DD:  06/04/00 TD:  06/05/00 Job: [id number]

## 2010-08-02 NOTE — Discharge Summary (Signed)
[name]               ACCOUNT NO.:  [id number]   MEDICAL RECORD NO.:  [id number]          PATIENT TYPE:  INP   LOCATION:  1621                         FACILITY:  [facility]   PHYSICIAN:  Angelia Mould. Derrell Lolling, M.D.DATE OF BIRTH:  May 23, 1926   DATE OF ADMISSION:  01/13/2006  DATE OF DISCHARGE:  01/17/2006                                 DISCHARGE SUMMARY   FINAL DIAGNOSES:  1. Possible partial small-bowel obstruction, resolved.  2. Urinary tract infection, recurrent.  3. Hypertension.  4. Status post multiple laparotomies.   HISTORY:  This is a 77 year old white female with a history of recurrent  small-bowel obstructions in the past, multiple laparotomies in the past for  benign disease, at least two colon resections in her life, a cholecystectomy  and at least two lysis of adhesions by Dr. Cyndia Bent.  She has had 2-  3 admissions in the last two years for GI problems and urinary tract  infections that did not require surgery.   She has had chronic diarrhea since 1983 because of her subtotal colectomy.  One day prior to this admission she developed abdominal pain and passed two  solid, hard bowel movements and has not had any bowel movements since.  She  has vomited four times, a small amount, bilious in nature.  She came to the  emergency room.  The emergency department physicians evaluated the patient  and ordered a CT scan, which suggested a partial small-bowel obstruction  with transition zone in the left lower quadrant but no sign of any  inflammatory process or tumor.  There is stool in the colon remnant.  She  reports having a flexible sigmoidoscopy within the last year, which shows  the anastomosis to be open.  She was admitted for evaluation and management.   PHYSICAL EXAMINATION:  GENERAL:  Pleasant older woman.  Normal body habitus  to slightly thin.  NG tube in place.  Alert, friendly.  Does not appear to  be any distress.  VITAL SIGNS:  Temperature 96.9,  pulse 76, blood pressure 196/100.  ABDOMEN:  Significant physical findings are limited to the abdomen, which  was soft and did not appear distended.  There was a midline scar, well  healed.  No hernias or masses.  Bowel sounds were normal to slightly  hyperactive.  No rushes or high-pitched sounds.  Mild diffuse tenderness but  no sign of any guarding or rebound.  There was no evidence of inguinal  hernia.   HOSPITAL COURSE:  The patient was admitted.  Lab work revealed a hemoglobin  of 14, white blood cell count of 15,000, normal complete metabolic panel.  Urinalysis showed 11-20 WBCs, and a culture was sent.   [facility] was asked to see the patient in consultation  because her medical problems and hypertension.  She had been given labetalol  and metoprolol for control.  They agreed with the beta blocker therapy.  They followed during the hospital stay.  They also recommend continuing IV  Synthroid because of her history of hypothyroidism and agreed with the urine  culture because of her  history of recurrent UTIs.   In terms of her surgical care, Dr. Cyndia Bent assumed her care on  01/15/2006 and throughout her hospital course since he had seen her many  times in the past.  He began giving her enemas, which had been helpful in  the past.  On 01/16/2006 her x-ray showed that the small-bowel obstruction  had resolved radiographically, and the urine culture was growing a  significant amount of E-coli.  She was started on Macrobid, and that was  switched to  amoxicillin.  Her diet was advanced, her bowels started moving, she did well  and was discharged home on 01/17/2006.  She was asked to follow up with Dr.  Laury Axon regarding her hypertension and medical problems and urinary tract  infection.  She is to follow up with Dr. Jamey Ripa p.r.n.      Angelia Mould. Derrell Lolling, M.D.  Electronically Signed     HMI/MEDQ  D:  02/04/2006  T:  02/04/2006  Job:  [id number]   cc:    [name], M.D.  [address]  [place]  [place] [address]   Janice Mort, MD  [address]  [place]  [place] [address]   Janice Perla, DO  [address] [place], [place] [address]

## 2010-08-02 NOTE — Discharge Summary (Signed)
NAMEJAZLINE, [name]               ACCOUNT NO.:  [id number]   MEDICAL RECORD NO.:  [id number]          PATIENT TYPE:  INP   LOCATION:  3008                         FACILITY:  [facility]   PHYSICIAN:  Gabrielle Dare. Janee Morn, MD  DATE OF BIRTH:  Aug 01, 1926   DATE OF ADMISSION:  05/05/2004  DATE OF DISCHARGE:  05/07/2004                                 DISCHARGE SUMMARY   DISCHARGE DIAGNOSES:  1.  Ileus, resolved.  2.  Urinary tract infection, off Cipro.  3.  History of diverticulosis.  4.  Remote history of diverticulitis.  5.  Cataracts.  6.  Osteoarthritis.  7.  Osteopenia.  8.  Restless legs syndrome.  9.  Gastroesophageal reflux disease.  10. Depression/anxiety.  11. Pancreatitis.  12. Rhinitis.   HOSPITAL COURSE:  [name] is a 77 year old female who presented to the  emergency room after a several-day history of abdominal pain.  This pain was  associated with nausea and vomiting and a one-week history of diarrhea.  On  review of her x-rays, she was noted to have a couple of small air-fluid  levels consistent with partial SBO versus ileus.  She was admitted for IV  hydration and bowel rest and the following day x-rays revealed resolution of  the ileus.  We advanced her diet, removed her NG tube, and by hospital day  #3 she was ready for discharge to home.  She was discharged to home in  stable condition on her home medications, which include Requip daily,  clonazepam as needed, Darvocet as needed, Celebrex, fluoxetine, Prilosec,  estrogen cream, __________ and cyclobenzaprine.  She does take trimethoprim  daily, but we have placed her on Cipro for one more week for her urinary  tract infection.  She will be on Cipro 500 mg twice a day for one week.  She  may advance her diet as tolerated.  She is to call us for any questions or  concerns at [phone], and follow up with Dr. Graceann Congress on May 21, 2004 at  11:20 a.m.      LB/MEDQ  D:  05/07/2004  T:  05/07/2004  Job:  [id number]   cc:    [name]  [address]  [place]  [place] [address]  Fax: [phone]

## 2010-08-02 NOTE — Discharge Summary (Signed)
[place]. [facility]  Patient:    [name], [name] Visit Number: [id number] MRN: [id number]          Service Type: MED Location: [phone] Attending Physician:  Berry, Jonathan [place] Dictated by:   Dianah Field, P.A. Admit Date:  01/31/2001 Disc. Date: 01/31/01                             Discharge Summary  REVIEW OF SYSTEMS:  No headaches, no visual problems.  No ENT, no rhinorrhea, no chest congestion.  GI:  As above.  GU:  No dysuria, frequency or change in the character of her urine.  She does get yearly mammograms.  Pulmonary:  No cough but some shortness of breath though with heavy exertion but she does walk 3 miles a day.  No extremity edema.  No significant weight loss.  PHYSICAL EXAMINATION:  GENERAL:  The patient is a pleasant and somewhat uncomfortable appearing white female who is a good historian.  VITAL SIGNS:  Blood pressure 110/57.  Pulse is 122.  Respirations are 18. Temperature is 97.2.  Saturations are 98% on room air.  HEENT:  No scleral icterus.  No pallor.  No conjunctival pallor and extraocular movements intact.  Oropharynx:  Mucous membranes are a bit dry but no lesions.  NECK:  There is no JVD, no bruits, and no thyromegaly.  CHEST:  Clear to auscultation and percussion bilaterally.  CARDIOVASCULAR:  There is a regular rate and rhythm with a soft systolic murmur.  CHEST:  clear to auscultation and percussion bilaterally.  ABDOMEN:  Soft, nondistended but tender diffusely but this is most marked in the left side of the abdomen both lower and upper quadrant.  No masses or hepatosplenomegaly.  Bowel sounds are hypoactive.  There is some increase to percussive tympany.  No hepatosplenomegaly.  RECTAL:  There is scant specks of stool.  There were not guaiaced.  No masses and rectal tone is within normal limits.  EXTREMITIES:  There is no pedal edema, dorsalis pedal pulses are 3+ bilaterally.  The feet are  warm.  LABORATORY DATA:  White blood cell count 15.7, hemoglobin 13.3, hematocrit 38.8, MCV 94.1, platelets 301.  Sodium 135, potassium 4.6, chloride 103, carbon dioxide 24.  Glucose is 123.  BUN 17, creatinine 1.1, albumin 3.7, total bilirubin 0.8.  Alk. phos. 54.  AST 23, ALT 13.  Total protein 7.4. Lipase 26, urinalysis is notable for cloudy appearance to the urine but no glucose, no bilirubin, no nitrites and no leukocyte esterase.  There are many epithelial cells so the specimen is probably contaminated but there are 3-6 white blood cells and 0-2 red blood cells and many bacteria present.  IMPRESSION: 1. Recurrent partial small bowel obstruction in a patient with history of    multiple bowel obstructions requiring surgery as well as medical management    in the past. 2. Leukocytosis, rule out reactive versus secondary to UTI.  The urinalysis is    contaminated so we will have to repeat this.  The acute abdominal series    did not show any evidence for chest infiltrates.  PLAN:  Patient to be admitted for IV fluids, bowel rest and IV analgesics and antiemetics.  For now, will not place an NG tube as she is not actively wretching but if nausea becomes refractory to medication will have to place an NG tube to suction. Dictated by:   Maralyn Sago  Fonnie Jarvis, P.A. Attending Physician:  Berry, Jonathan [place] DD:  01/31/01 TD:  01/31/01 Job: [id number]

## 2010-08-02 NOTE — H&P (Signed)
[place]  Patient:    [name], [name] Visit Number: [id number] MRN: [id number]          Service Type: MED Location: [phone] Attending Physician:  Berry, Jonathan [place] Dictated by:   Dianah Field, P.A. Admit Date:  01/31/2001                           History and Physical  INCOMPLETE  CHIEF COMPLAINT:  Abdominal pain and nausea.  HISTORY OF PRESENT ILLNESS:  The patient is a 77 year old white female who is well known to the [place] GI service.  She has a history of multiple small-bowel obstructions requiring both surgery as well as medical management. The bulk of these has occurred at [facility], and only lately has she become a patient of Dr. Victorino Dike.  Her last admission for medical management of a small-bowel obstruction occurred March 2002, when she was admitted for about two or three days for medical management.  She did not require an NG suction then.  The x-ray showed very soft findings of partial small-bowel obstruction, and most of them read more ileus.  The patient has periodic bouts with abdominal pain and nausea.  About 10 days ago she had a couple of days, not in a row, where she had diffuse abdominal pain with nausea and small amounts of vomiting.  These seemed to be resolved.  However, in the last few days she has been having some abdominal discomfort and, on the evening prior to this admission, she developed acute worsening of the abdominal pain along with nausea and vomiting.  She threw up repeatedly, up to 12 or 15 times at home.  There was no coffee-grounds or hematemesis.  She had had a bowel movement that afternoon.  She had had lunch at about midday, and the symptoms started in the early evening.  The patient took a total of three Vicodin but had no relief from these.  She also had Phenergan available, which did not help the nausea.  She took some Klonopin, which she has available for her restless  leg syndrome and, when the symptoms persisted, she came to the emergency room at [facility] for evaluation.  Acute abdominal series showed some air fluid levels in the small bowel but no absolute obstruction.  The reading was felt to be representative of either an ileus or an emerging partial small-bowel obstruction.  The patients white count was elevated.  She was not having any specific urinary tract symptoms.  She was evaluated by the [place] GI service and was admitted from the minor side of the ER for supportive care.  RECENT GASTROINTESTINAL STUDIES: 1. Flexible sigmoidoscopy April 2000.  Study normal.  Study performed    secondary to history of diarrhea and cramps. 2. Small-bowel follow-through April 2000, showed appearance of resected colon    and one foot of remaining colon.  Transit time through the bowel was rapid. 3. Upper endoscopy/EGD x 2 in 1994.  The first showed a pyloric channel ulcer.     The second showed healed appearance to the ulcer but persistent mild     gastritis and duodenitis.  PAST MEDICAL HISTORY:  1. History of peptic ulcer disease.  2. History of diverticulitis.  3. Status post surgery following performed diverticulum, at which time     appendectomy was performed.  4. Status post subtotal colectomy for complicated diverticulosis in 1993.  5. Status  post two separate surgeries in 1995 for lysis of adhesions, which     had caused small-bowel obstruction.  6. Status post periodic and multiple hospitalizations for management of     small-bowel obstructions, most of which have been at [facility].  7. Status post laparoscopic tubal ligation.  8. Status post open cholecystectomy.  9. Status post right cataract repair, with further surgery secondary to     complications. 10. Osteoarthritis. 11. Restless leg syndrome. 12. Depression.  CURRENT MEDICATIONS:  1. Prilosec 20 mg p.o. q.d.  2. Zyrtec 10 mg p.o. q.d.  3. Flexeril 10 mg p.o. at h.s.   4. Clonazepam 0.5 mg p.o. t.i.d.  5. Requip 1 mg p.o. t.i.d.  6. Premarin 1.25 mg p.o. q.o.d.  This has been tapering recently, with plans     for ultimate discontinuation.  7. Provera 2.5 mg p.o. q.o.d., also on tapering dose, with ultimate plan to     discontinue.  8. Celebrex 200 mg p.o. q.d.  9. Fosamax 75 mg p.o. each week. 10. Darvocet-N 100 as needed. 11. Oxycodone p.r.n. 12. Phenergan suppository 25 mg p.r.n. 13. Neurontin 300 mg p.o. q.h.s. 14. Amitriptyline 50 mg p.o. q.h.s.  SOCIAL HISTORY:  The patient is a widow.  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  Her son is a Clinical research associate in [employer].  She walks about three miles most days of the week.  She does not smoke or drink.  FAMILY HISTORY:  There is Crohns disease in a sister, and she has had surgery for either the Crohns disease or diverticulosis.  Another sister has a history of diverticulosis/diverticulitis.  Three of her sisters have had breast cancer. Dictated by:   Dianah Field, P.A. Attending Physician:  Berry, Jonathan [place]azil[place] DD:  02/01/01 TD:  02/01/01 Job: [id number]

## 2010-08-02 NOTE — H&P (Signed)
[name], [name]               ACCOUNT NO.:  [id number]   MEDICAL RECORD NO.:  [id number]          PATIENT TYPE:  INP   LOCATION:  1610                         FACILITY:  [facility]   PHYSICIAN:  Rachael Fee, M.D. DATE OF BIRTH:  1926-07-07   DATE OF ADMISSION:  12/16/2004  DATE OF DISCHARGE:                                HISTORY & PHYSICAL   CHIEF COMPLAINT:  Nausea, vomiting, and abdominal pain, feels like bowel  obstruction.   HISTORY:  [name] is a pleasant 77 year old white female well known to the  GI service who has had multiple admissions over the years for recurrent  small-bowel obstructions. She has history of a subtotal colectomy in 1993  done for complicated diverticular disease with a perforated diverticulum.  She has also had cholecystectomy which was done laparoscopically, and an  exploratory laparotomy at age 21, and last exploratory laparotomy with lysis  of adhesions done in 1995. She also has history of recurrent urinary tract  infections, osteoarthritis, osteopenia, restless leg syndrome, GERD,  depression, and did have an episode of pancreatitis several years ago. She  has had admission in February 2006, June 2006, and September 2006, all with  recurrent partial small-bowel obstructions which have resolved with  conservative management. She was last hospitalized in mid September 2006 at  [facility] with a recurrent small-bowel obstruction. Was seen by surgery  during that admission and was to follow up with Dr. Daphine Deutscher regarding lysis  of adhesions later this week. She did undergo cardiac workup during that  admission due to some complaints of chest tightness. Barium enema that was  done November 28, 2004, shows no significant small bowel distension. Barium  enema was negative for any colonic or distal small bowel source of her  obstruction. CT scan of the abdomen and pelvis done September 02, 2004, did show  evidence of mid small-bowel obstruction with dilated  small bowel, with air-  fluid levels in the jejunum. Small-bowel follow-through was done in January  2006 during an asymptomatic period and this was read as normal. At this time  the patient says she did fine after her recent discharge until the day prior  to this admission when she began with recurrent abdominal pain. She  generally has loose stools and had been having loose stools until yesterday  afternoon, when her bowel movements stopped. She had pain all during the  night, was unable to eat this morning though with no real distension. She  began with nausea and then has vomited several times this afternoon what she  describes as very hard vomiting with large-volume emesis. She called her  son, who then called our office, and they were advised to come on to the  emergency room for further evaluation. She says her symptoms are very  similar to her prior obstructions. She has not had any documented fever or  chills. The patient was seen and evaluated in the emergency room, felt to be  hemodynamically stable, and admitted for pain control, NG decompression, and  then further diagnostic evaluation.   Laboratory studies on admission showed a WBC of  21,000; hemoglobin 14.8;  hematocrit of 44.1. Coags within normal limits. Sodium 132, potassium 4.2,  BUN was 37, creatinine 2.1. Liver function studies normal. Lipase was 25.  KUB was pending at the time of admission.   MEDICATIONS:  1.  Actonel 35 mg weekly.  2.  Clonazepam 0.5 mg p.o. p.r.n.  3.  Zyrtec 10 mg daily.  4.  Celebrex 200 mg daily.  5.  Prozac 40 mg daily.  6.  Requip 1 mg breakfast, lunch, and dinner, and 2 mg at h.s.  7.  Prilosec 10 mg daily.  8.  Estrogen cream 0.1% vaginally three times weekly.  9.  Flexeril 10 mg q.h.s.  10. Synthroid 25 mcg daily.   ALLERGIES:  DEMEROL, which she is intolerant to.   PAST MEDICAL HISTORY:  As outlined above.   SOCIAL HISTORY:  The patient lives alone. She does have a son nearby  who  accompanies her today. No tobacco and no EtOH.   FAMILY HISTORY:  Positive for Crohn's disease and celiac disease. She has a  daughter with celiac disease and one sister with Crohn's disease, another  sister with diverticular disease.   REVIEW OF SYSTEMS:  Pertinent for arthritic symptoms and recurrent urinary  tract infections. She is currently asymptomatic, has been on suppressive  therapy with Bactrim.   PHYSICAL EXAMINATION:  GENERAL:  Well-developed elderly white female who  appears younger than her stated age. Alert and oriented at the time of  admission.  VITAL SIGNS:  Temperature 97.4, heart rate 98, respirations 24, blood  pressure 160/90.  HEENT:  Nontraumatic, normocephalic. EOMI, PERRLA. Sclerae anicteric.  NECK:  Supple without nodes.  CARDIOVASCULAR:  Regular rate and rhythm with S1 and S2, slightly  tachycardic.  PULMONARY:  Clear to A&P.  ABDOMEN:  Nondistended. Bowel sounds are present but somewhat hypoactive.  She is tender across the upper abdomen with no rebound or guarding. She does  have multiple incisional scars.  RECTAL: Exam was not done at the time of admission.  EXTREMITIES: Without clubbing, cyanosis or edema.   IMPRESSION:  1.  A 77 year old white female with probable recurrent small-bowel      obstruction secondary to adhesions, with three previous admissions this      year with similar symptoms.  2.  Status post subtotal colectomy remotely, cholecystectomy, and laparotomy      with lysis of adhesions x2.  3.  Gastroesophageal reflux disease.  4.  Depression.  5.  Arthritis.  6.  Osteoporosis.  7.  History of idiopathic pancreatitis. 2003.  8.  Recurrent urinary tract infections on suppressive therapy.  9.  Restless leg syndrome.   PLAN:  The patient is admitted to the service of Dr. Wendall Papa for IV fluid  hydration, pain control, NG decompression, follow-up abdominal films, and  will ask Dr. Daphine Deutscher to see her in the morning for  consideration of laparotomy with lysis of adhesions, given the recurrent nature of her  obstructions and increasing frequency of hospitalization over the past 75 year.      Mike Gip, P.A.-C. [facility]    ______________________________  Rachael Fee, M.D.    AE/MEDQ  D:  12/17/2004  T:  12/17/2004  Job:  [id number]   cc:   [place] Daphine Deutscher, MD  [address], [address]  [place]  [place] [address]   Loreen Freud, M.D.  [phone]. [address] [place]  [place] [address]

## 2010-08-06 ENCOUNTER — Emergency Department (HOSPITAL_BASED_OUTPATIENT_CLINIC_OR_DEPARTMENT_OTHER)
Admission: EM | Admit: 2010-08-06 | Discharge: 2010-08-06 | Disposition: A | Discharge status: Home / Self Care | Payer: Medicare Other | Attending: Emergency Medicine | Admitting: Emergency Medicine

## 2010-08-06 ENCOUNTER — Emergency Department (INDEPENDENT_AMBULATORY_CARE_PROVIDER_SITE_OTHER): Payer: Medicare Other

## 2010-08-06 DIAGNOSIS — M25519 Pain in unspecified shoulder: Secondary | ICD-10-CM

## 2010-08-06 DIAGNOSIS — W19XXXA Unspecified fall, initial encounter: Secondary | ICD-10-CM

## 2010-08-06 DIAGNOSIS — R071 Chest pain on breathing: Secondary | ICD-10-CM | POA: Insufficient documentation

## 2010-08-06 DIAGNOSIS — Y92009 Unspecified place in unspecified non-institutional (private) residence as the place of occurrence of the external cause: Secondary | ICD-10-CM | POA: Insufficient documentation

## 2010-08-06 DIAGNOSIS — Z79899 Other long term (current) drug therapy: Secondary | ICD-10-CM | POA: Insufficient documentation

## 2010-08-06 DIAGNOSIS — K219 Gastro-esophageal reflux disease without esophagitis: Secondary | ICD-10-CM | POA: Insufficient documentation

## 2010-08-06 DIAGNOSIS — S139XXA Sprain of joints and ligaments of unspecified parts of neck, initial encounter: Secondary | ICD-10-CM | POA: Insufficient documentation

## 2010-08-06 DIAGNOSIS — E039 Hypothyroidism, unspecified: Secondary | ICD-10-CM | POA: Insufficient documentation

## 2010-08-07 ENCOUNTER — Ambulatory Visit (INDEPENDENT_AMBULATORY_CARE_PROVIDER_SITE_OTHER): Payer: Medicare Other | Admitting: Family Medicine

## 2010-08-07 ENCOUNTER — Encounter: Payer: Self-pay | Admitting: Family Medicine

## 2010-08-07 VITALS — BP 120/82 | HR 82 | Temp 97.7°F | Ht 59.0 in | Wt 138.4 lb

## 2010-08-07 DIAGNOSIS — K219 Gastro-esophageal reflux disease without esophagitis: Secondary | ICD-10-CM | POA: Insufficient documentation

## 2010-08-07 DIAGNOSIS — M898X1 Other specified disorders of bone, shoulder: Secondary | ICD-10-CM | POA: Insufficient documentation

## 2010-08-07 DIAGNOSIS — M25519 Pain in unspecified shoulder: Secondary | ICD-10-CM

## 2010-08-07 NOTE — Assessment & Plan Note (Signed)
Right clavicle pain - most likely subluxed or sprained  joint with fall.  Exam and history/mechanism most consistent with anterior subluxation of clavicle on sternum.  Do NOT believe she subluxed or dislocated posteriorly - able to follow and palpate clavicle medially - is not posterior to sternum.  Medial nondisplaced clavicle fracture and contusion are also possibilities.  All are treated similarly.  Sling, icing, pain medication as needed.  Avoid NSAIDs with her GI history.  See instructions for further.  If not improving after 2 weeks will repeat radiographs.  She gets narcotic pain medication regularly from her PCP - advised her all these medicines should go through Dr. Dimple Casey.

## 2010-08-07 NOTE — Progress Notes (Signed)
  Subjective:    Patient ID: [name], female    DOB: [birth year], 77 y.o.   MRN: [id number]  HPI  77 yo F here with right clavicle/shoulder pain.  Patient reports about 2 weeks ago she was helping put groceries away when she slipped and fell directly onto right elbow. Scraped elbow up and developed pain in medial clavicle on right side. No swelling or bruising. No pain in right shoulder with this. No numbness or tingling. No radiation into her right arm. No posterior neck pain or pain with neck ROM. Takes hydrocodone for pain issues - given oxycodone from ED for this as well. Has not been icing. X-rays in ED of right shoulder and clavicle were negative.  Past Medical History  Diagnosis Date  . Stomach irritation   . Diverticulitis   . Hypertension   . Depression   . Hypothyroid   . GERD (gastroesophageal reflux disease)     No current outpatient prescriptions on file prior to visit.    Past Surgical History  Procedure Date  . Colon resection     No Known Allergies  History   Social History  . Marital Status: Widowed    Spouse Name: N/A    Number of Children: N/A  . Years of Education: N/A   Occupational History  . Not on file.   Social History Main Topics  . Smoking status: Never Smoker   . Smokeless tobacco: Not on file  . Alcohol Use: Not on file  . Drug Use: Not on file  . Sexually Active: Not on file   Other Topics Concern  . Not on file   Social History Narrative  . No narrative on file    Family History  Problem Relation Age of Onset  . Hypertension Sister   . Hyperlipidemia Neg Hx   . Diabetes Neg Hx     BP 120/82  Pulse 82  Temp(Src) 97.7 F (36.5 C) (Oral)  Ht 4\' 11"  (1.499 m)  Wt 138 lb 6.4 oz (62.778 kg)  BMI 27.95 kg/m2  Review of Systems See HPI above.    Objective:   Physical Exam Gen: NAD R shoulder: No swelling, bruising, erythema, redness of right shoulder, clavicle, or [place] joint. Medial clavicle appears  slightly more anterior than left at [facility] joint. TTP greatest at [facility] joint and medial portion of clavicle.  No palpable stepoffs or crepitation. No TTP about shoulder. FROM without pain or painful arc. Strength 5/5 with empty can, resisted IR/ER Negative hawkins, speeds, yergasons. Negative apprehension. NVI distally with 2+ radial pulse and < 2 sec cap refill.     Assessment & Plan:  1. Right clavicle pain - most likely subluxed or sprained [place] joint with fall.  Exam and history/mechanism most consistent with anterior subluxation of clavicle on sternum.  Do NOT believe she subluxed or dislocated posteriorly - able to follow and palpate clavicle medially - is not posterior to sternum.  Medial nondisplaced clavicle fracture and contusion are also possibilities.  All are treated similarly.  Sling, icing, pain medication as needed.  Avoid NSAIDs with her GI history.  See instructions for further.  If not improving after 2 weeks will repeat radiographs.  She gets narcotic pain medication regularly from her PCP - advised her all these medicines should go through Dr. Dimple Casey.

## 2010-08-07 NOTE — Patient Instructions (Signed)
Your history and exam are consistent with one of three things (all are treated the same): sprain of your sternoclavicular joint (between breastbone and collarbone), bruise of this area, or a fracture that didn't show up on first x-rays. Use sling when you are up and walking around.  Ok to take off to shower, sleep, and drive. Continue with your pain medication - if you need a refill, call your family physician to refill it as you have in the past. Ice the area 15 minutes at a time 3-4 times a day. Follow up with me in 2 weeks- if you're not improving we will repeat x-rays. Typically this can take up to 6-8 weeks to get completely better though most people improve sooner.

## 2010-08-21 ENCOUNTER — Encounter: Payer: Self-pay | Admitting: Family Medicine

## 2010-08-21 ENCOUNTER — Ambulatory Visit (INDEPENDENT_AMBULATORY_CARE_PROVIDER_SITE_OTHER): Payer: Medicare Other | Admitting: Family Medicine

## 2010-08-21 VITALS — BP 154/99 | HR 90 | Temp 98.1°F | Ht <= 58 in | Wt 130.0 lb

## 2010-08-21 DIAGNOSIS — M898X1 Other specified disorders of bone, shoulder: Secondary | ICD-10-CM

## 2010-08-21 DIAGNOSIS — M25519 Pain in unspecified shoulder: Secondary | ICD-10-CM

## 2010-08-22 ENCOUNTER — Encounter: Payer: Self-pay | Admitting: Family Medicine

## 2010-08-22 NOTE — Assessment & Plan Note (Signed)
Right [place] subluxation vs anterior dislocation - patient significantly improved - most likely subluxation.  Discontinue sling and icing.  F/u prn.

## 2010-08-22 NOTE — Progress Notes (Signed)
Subjective:    Patient ID: [name], female    DOB: 08-Jan-1927, 77 y.o.   MRN: [id number]  HPI   77 yo F here with right clavicle/shoulder pain.  Last OV: Patient reports about 2 weeks ago she was helping put groceries away when she slipped and fell directly onto right elbow. Scraped elbow up and developed pain in medial clavicle on right side. No swelling or bruising. No pain in right shoulder with this. No numbness or tingling. No radiation into her right arm. No posterior neck pain or pain with neck ROM. Takes hydrocodone for pain issues - given oxycodone from ED for this as well. Has not been icing. X-rays in ED of right shoulder and clavicle were negative.  Today: Patient reports significant improvement over past 2 weeks. No longer requiring sling. Still icing some. 0/10 pain currently. Only gets pain at [facility] joint when she overworks her right shoulder. No bruising.  Swelling still present as it was at last visit.  Past Medical History  Diagnosis Date  . Stomach irritation   . Diverticulitis   . Hypertension   . Depression   . Hypothyroid   . GERD (gastroesophageal reflux disease)     Current Outpatient Prescriptions on File Prior to Visit  Medication Sig Dispense Refill  . amLODipine (NORVASC) 2.5 MG tablet       . clonazePAM (KLONOPIN) 0.5 MG tablet       . CYMBALTA 60 MG capsule       . HYDROcodone-acetaminophen (VICODIN ES) 7.5-750 MG per tablet       . LEVOTHROID 50 MCG tablet       . omeprazole (PRILOSEC) 40 MG capsule       . rOPINIRole (REQUIP) 1 MG tablet       . Vitamin D, Ergocalciferol, (DRISDOL) 50000 UNITS CAPS         Past Surgical History  Procedure Date  . Colon resection     No Known Allergies  History   Social History  . Marital Status: Widowed    Spouse Name: N/A    Number of Children: N/A  . Years of Education: N/A   Occupational History  . Not on file.   Social History Main Topics  . Smoking status: Never Smoker     . Smokeless tobacco: Not on file  . Alcohol Use: Not on file  . Drug Use: Not on file  . Sexually Active: Not on file   Other Topics Concern  . Not on file   Social History Narrative  . No narrative on file    Family History  Problem Relation Age of Onset  . Hypertension Sister   . Hyperlipidemia Neg Hx   . Diabetes Neg Hx     BP 154/99  Pulse 90  Temp(Src) 98.1 F (36.7 C) (Oral)  Ht 4\' 9"  (1.448 m)  Wt 130 lb (58.968 kg)  BMI 28.13 kg/m2  Review of Systems  See HPI above.    Objective:   Physical Exam  Gen: NAD R shoulder: No swelling, bruising, erythema, redness of right shoulder, clavicle, or [place] joint. Medial clavicle appears slightly more anterior than left at [facility] joint. Minimal TTP at [facility] joint, no TTP clavicle.  No crepitation. No TTP about shoulder. FROM without pain or painful arc. NVI distally with 2+ radial pulse and < 2 sec cap refill.     Assessment & Plan:  1. Right [place] subluxation vs anterior dislocation - patient significantly improved - most  likely subluxation.  Discontinue sling and icing.  F/u prn.

## 2010-12-13 LAB — POCT HEMOGLOBIN-HEMACUE: Operator id: 268271

## 2010-12-25 LAB — POCT HEMOGLOBIN-HEMACUE: Operator id: 133231

## 2011-01-02 LAB — BASIC METABOLIC PANEL
CO2: 22
Calcium: 8.9
Chloride: 107
GFR calc Af Amer: 42 — ABNORMAL LOW
GFR calc Af Amer: 49 — ABNORMAL LOW
GFR calc non Af Amer: 35 — ABNORMAL LOW
Glucose, Bld: 111 — ABNORMAL HIGH
Potassium: 5.4 — ABNORMAL HIGH
Sodium: 137

## 2011-01-02 LAB — URINE CULTURE

## 2011-01-02 LAB — HEPATIC FUNCTION PANEL
ALT: 18
AST: 29
Albumin: 3.9
Alkaline Phosphatase: 89
Total Bilirubin: 0.7

## 2011-01-02 LAB — AMYLASE: Amylase: 206 — ABNORMAL HIGH

## 2011-01-02 LAB — DIFFERENTIAL
Basophils Absolute: 0
Basophils Relative: 0
Eosinophils Relative: 0
Lymphocytes Relative: 20
Lymphs Abs: 0.7
Monocytes Absolute: 0.5
Monocytes Absolute: 0.7
Neutro Abs: 21.4 — ABNORMAL HIGH
Neutro Abs: 4.6

## 2011-01-02 LAB — URINALYSIS, ROUTINE W REFLEX MICROSCOPIC
Bilirubin Urine: NEGATIVE
Hgb urine dipstick: NEGATIVE
Ketones, ur: NEGATIVE
Specific Gravity, Urine: 1.02
Urobilinogen, UA: 0.2

## 2011-01-02 LAB — CBC
HCT: 43.4
Hemoglobin: 12.1
MCV: 95.6
RBC: 4.54
RDW: 13.5
WBC: 22.6 — ABNORMAL HIGH

## 2011-01-02 LAB — URINE MICROSCOPIC-ADD ON

## 2011-01-02 LAB — LIPASE, BLOOD: Lipase: 29

## 2012-12-09 ENCOUNTER — Encounter (HOSPITAL_BASED_OUTPATIENT_CLINIC_OR_DEPARTMENT_OTHER): Payer: Self-pay | Admitting: *Deleted

## 2012-12-09 ENCOUNTER — Emergency Department (HOSPITAL_BASED_OUTPATIENT_CLINIC_OR_DEPARTMENT_OTHER)
Admission: EM | Admit: 2012-12-09 | Discharge: 2012-12-09 | Disposition: A | Discharge status: Home / Self Care | Payer: Medicare Other | Attending: Emergency Medicine | Admitting: Emergency Medicine

## 2012-12-09 DIAGNOSIS — N12 Tubulo-interstitial nephritis, not specified as acute or chronic: Secondary | ICD-10-CM | POA: Insufficient documentation

## 2012-12-09 DIAGNOSIS — I10 Essential (primary) hypertension: Secondary | ICD-10-CM | POA: Insufficient documentation

## 2012-12-09 DIAGNOSIS — F329 Major depressive disorder, single episode, unspecified: Secondary | ICD-10-CM | POA: Insufficient documentation

## 2012-12-09 DIAGNOSIS — Z87440 Personal history of urinary (tract) infections: Secondary | ICD-10-CM | POA: Insufficient documentation

## 2012-12-09 DIAGNOSIS — Z9049 Acquired absence of other specified parts of digestive tract: Secondary | ICD-10-CM | POA: Insufficient documentation

## 2012-12-09 DIAGNOSIS — F3289 Other specified depressive episodes: Secondary | ICD-10-CM | POA: Insufficient documentation

## 2012-12-09 DIAGNOSIS — Z7982 Long term (current) use of aspirin: Secondary | ICD-10-CM | POA: Insufficient documentation

## 2012-12-09 DIAGNOSIS — E860 Dehydration: Secondary | ICD-10-CM | POA: Insufficient documentation

## 2012-12-09 DIAGNOSIS — Z8719 Personal history of other diseases of the digestive system: Secondary | ICD-10-CM | POA: Insufficient documentation

## 2012-12-09 DIAGNOSIS — E039 Hypothyroidism, unspecified: Secondary | ICD-10-CM | POA: Insufficient documentation

## 2012-12-09 DIAGNOSIS — K219 Gastro-esophageal reflux disease without esophagitis: Secondary | ICD-10-CM | POA: Insufficient documentation

## 2012-12-09 DIAGNOSIS — Z79899 Other long term (current) drug therapy: Secondary | ICD-10-CM | POA: Insufficient documentation

## 2012-12-09 LAB — CBC WITH DIFFERENTIAL/PLATELET
Basophils Relative: 1 % (ref 0–1)
Eosinophils Absolute: 1.3 10*3/uL — ABNORMAL HIGH (ref 0.0–0.7)
Eosinophils Relative: 13 % — ABNORMAL HIGH (ref 0–5)
Lymphs Abs: 2.4 10*3/uL (ref 0.7–4.0)
MCH: 29.6 pg (ref 26.0–34.0)
MCHC: 32.4 g/dL (ref 30.0–36.0)
MCV: 91.6 fL (ref 78.0–100.0)
Neutrophils Relative %: 51 % (ref 43–77)
Platelets: 199 10*3/uL (ref 150–400)
RDW: 15.4 % (ref 11.5–15.5)

## 2012-12-09 LAB — BASIC METABOLIC PANEL
BUN: 45 mg/dL — ABNORMAL HIGH (ref 6–23)
CO2: 19 mEq/L (ref 19–32)
Calcium: 10.1 mg/dL (ref 8.4–10.5)
Chloride: 109 mEq/L (ref 96–112)
GFR calc Af Amer: 36 mL/min — ABNORMAL LOW (ref >90)
GFR calc non Af Amer: 31 mL/min — ABNORMAL LOW (ref >90)
Glucose, Bld: 102 mg/dL — ABNORMAL HIGH (ref 70–99)
Potassium: 4.8 mEq/L (ref 3.5–5.1)
Sodium: 140 mEq/L (ref 135–145)

## 2012-12-09 LAB — OCCULT BLOOD X 1 CARD TO LAB, STOOL: Fecal Occult Bld: NEGATIVE

## 2012-12-09 LAB — URINE MICROSCOPIC-ADD ON

## 2012-12-09 LAB — URINALYSIS, ROUTINE W REFLEX MICROSCOPIC
Glucose, UA: NEGATIVE mg/dL
Nitrite: POSITIVE — AB
Protein, ur: NEGATIVE mg/dL
Urobilinogen, UA: 0.2 mg/dL (ref 0.0–1.0)

## 2012-12-09 MED ORDER — DEXTROSE 5 % IV SOLN
1.0000 g | Freq: Once | INTRAVENOUS | Status: AC
  Administered 2012-12-09: 1 g via INTRAVENOUS

## 2012-12-09 MED ORDER — SODIUM CHLORIDE 0.9 % IV SOLN
Freq: Once | INTRAVENOUS | Status: AC
  Administered 2012-12-09: 06:00:00 via INTRAVENOUS

## 2012-12-09 MED ORDER — CEFTRIAXONE SODIUM 1 G IJ SOLR
INTRAMUSCULAR | Status: AC
  Filled 2012-12-09: qty 10

## 2012-12-09 MED ORDER — CEPHALEXIN 500 MG PO CAPS: 500.0000 mg | ORAL_CAPSULE | Freq: Two times a day (BID) | ORAL | Status: DC

## 2012-12-09 NOTE — ED Provider Notes (Signed)
CSN: [id number]     Arrival date & time 12/09/12  0454 History   First MD Initiated Contact with Patient 12/09/12 0503     Chief Complaint  Patient presents with  . Abdominal Pain   (Consider location/radiation/quality/duration/timing/severity/associated sxs/prior Treatment) HPI Comments: 79 y/o woman comes in with cc of abdominal pain. States that she has about 2-3 days of abd pain, lower quadrants, and lower back pain. She has hx of diverticuulitis, s/p colon resection, multiple SBO, and multipe UTIs and she is sure that her currentsx are related to a UTI. She has no n/v/f/c/anorexia. Does indicate polyuria and some hematuria now. She states that she was seen by PCP and her UA was negative - but her sx have gotten worse. No bloody stools, last BM was y'day, passing flatus. No hx of renal stones.  Patient is a 79 y.o. female presenting with abdominal pain. The history is provided by the patient.  Abdominal Pain Associated symptoms: dysuria and hematuria   Associated symptoms: no chest pain, no chills, no fever, no nausea, no shortness of breath and no vomiting     Past Medical History  Diagnosis Date  . Stomach irritation   . Diverticulitis   . Hypertension   . Depression   . Hypothyroid   . GERD (gastroesophageal reflux disease)    Past Surgical History  Procedure Laterality Date  . Colon resection     Family History  Problem Relation Age of Onset  . Hypertension Sister   . Hyperlipidemia Neg Hx   . Diabetes Neg Hx    History  Substance Use Topics  . Smoking status: Never Smoker   . Smokeless tobacco: Not on file  . Alcohol Use: Not on file   OB History   Grav Para Term Preterm Abortions TAB SAB Ect Mult Living                 Review of Systems  Constitutional: Negative for fever, chills and activity change.  HENT: Negative for neck pain.   Respiratory: Negative for shortness of breath.   Cardiovascular: Negative for chest pain.  Gastrointestinal: Positive for  abdominal pain. Negative for nausea and vomiting.  Genitourinary: Positive for dysuria, frequency and hematuria.  Neurological: Negative for headaches.    Allergies  Ace inhibitors  Home Medications   Current Outpatient Rx  Name  Route  Sig  Dispense  Refill  . aspirin 81 MG tablet   Oral   Take 81 mg by mouth daily.         Marland Kitchen FLUoxetine (PROZAC) 20 MG capsule   Oral   Take 20 mg by mouth daily.         . metoprolol tartrate (LOPRESSOR) 25 MG tablet   Oral   Take 25 mg by mouth 2 (two) times daily.         Marland Kitchen amLODipine (NORVASC) 2.5 MG tablet               . clonazePAM (KLONOPIN) 0.5 MG tablet               . CYMBALTA 60 MG capsule               . HYDROcodone-acetaminophen (VICODIN ES) 7.5-750 MG per tablet               . LEVOTHROID 50 MCG tablet               . omeprazole (PRILOSEC) 40 MG capsule               .  rOPINIRole (REQUIP) 1 MG tablet               . Vitamin D, Ergocalciferol, (DRISDOL) 50000 UNITS CAPS                BP 159/69  Pulse 68  Temp(Src) 98.5 F (36.9 C) (Oral)  Resp 16  Ht 4\' 11"  (1.499 m)  Wt 130 lb (58.968 kg)  BMI 26.24 kg/m2  SpO2 99% Physical Exam  Constitutional: She is oriented to person, place, and time. She appears well-developed and well-nourished.  HENT:  Head: Normocephalic and atraumatic.  Eyes: EOM are normal. Pupils are equal, round, and reactive to light.  Neck: Neck supple.  Cardiovascular: Normal rate, regular rhythm and normal heart sounds.   No murmur heard. Pulmonary/Chest: Effort normal. No respiratory distress.  Abdominal: Soft. She exhibits no distension. There is tenderness. There is no rebound and no guarding.  Diffuse abd tenderness, mostly in the lower quadrant and suprapubic region, no masses, and also bilateral lower back tenderness CVA tenderness.  Neurological: She is alert and oriented to person, place, and time.  Skin: Skin is warm and dry.    ED Course   Procedures (including critical care time) Labs Review Labs Reviewed  URINALYSIS, ROUTINE W REFLEX MICROSCOPIC - Abnormal; Notable for the following:    APPearance CLOUDY (*)    Nitrite POSITIVE (*)    Leukocytes, UA LARGE (*)    All other components within normal limits  CBC WITH DIFFERENTIAL - Abnormal; Notable for the following:    RBC 3.34 (*)    Hemoglobin 9.9 (*)    HCT 30.6 (*)    Eosinophils Relative 13 (*)    Eosinophils Absolute 1.3 (*)    All other components within normal limits  URINE MICROSCOPIC-ADD ON - Abnormal; Notable for the following:    Bacteria, UA MANY (*)    All other components within normal limits  URINE CULTURE  BASIC METABOLIC PANEL   Imaging Review No results found.  MDM  No diagnosis found.  Pt comes in with cc of abd pain. Has hx of multiple abd surgeries, SBO and UTI - with her insisting that her current sx are consistent with her UTI. Plan was to get basic labs and a UA - which if negative, get a CT scan to ensure there is no Diverticulitis or related complication. UA however shows clear evidence of infection. Due to associated back pain - likely pyelonephritis. She is tolerating po well, and we will ive her dose of ceftriaxone here, culture the urine and send her with keflex and return precautions. Pt is not septic and has no nausea, emesis. She has a PCP and reliable follow up.  Derwood Kaplan, MD 12/09/12 [phone]

## 2012-12-09 NOTE — ED Notes (Signed)
MD at bedside. 

## 2012-12-09 NOTE — ED Notes (Signed)
Cab voucher given, antibiotic rx [facility] and given to patient. Pt given snack and coffee while waiting for cab to transport back to [facility] on [address].

## 2012-12-09 NOTE — ED Notes (Signed)
VO from Dr. Rhunette Croft to increase NS infusion from 158mL/hr to bolus the entire liter.

## 2012-12-09 NOTE — ED Notes (Signed)
Pt c/o bilat lower back pain and abdominal pain x3-4 days. Pt sts that she has had several UTI's and this feels the same. She noticed blood in her urine yesterday. Pt was seen by her PMD yesterday but sts that her son would not let her discuss the back pain or abd pain and would only let her talk to the MD about her anxiety.

## 2012-12-11 LAB — URINE CULTURE

## 2012-12-12 ENCOUNTER — Telehealth (HOSPITAL_COMMUNITY): Payer: Self-pay | Admitting: Emergency Medicine

## 2012-12-12 NOTE — ED Notes (Signed)
Post ED Visit - Positive Culture Follow-up  Culture report reviewed by antimicrobial stewardship pharmacist: []  Wes Dulaney, Pharm.D., BCPS []  Celedonio Miyamoto, Pharm.D., BCPS [x]  Georgina Pillion, [address].D., BCPS []  [place], [place].D., BCPS, AAHIVP []  Estella Husk, Pharm.D., BCPS, AAHIVP  Positive urine culture Treated with Keflex, organism sensitive to the same and no further patient follow-up is required at this time.  Kylie A Holland 12/12/2012, 6:23 PM

## 2012-12-19 ENCOUNTER — Emergency Department (HOSPITAL_BASED_OUTPATIENT_CLINIC_OR_DEPARTMENT_OTHER)
Admission: EM | Admit: 2012-12-19 | Discharge: 2012-12-19 | Disposition: A | Discharge status: Home / Self Care | Payer: Medicare Other | Attending: Emergency Medicine | Admitting: Emergency Medicine

## 2012-12-19 ENCOUNTER — Encounter (HOSPITAL_BASED_OUTPATIENT_CLINIC_OR_DEPARTMENT_OTHER): Payer: Self-pay

## 2012-12-19 DIAGNOSIS — Z792 Long term (current) use of antibiotics: Secondary | ICD-10-CM | POA: Insufficient documentation

## 2012-12-19 DIAGNOSIS — K219 Gastro-esophageal reflux disease without esophagitis: Secondary | ICD-10-CM | POA: Insufficient documentation

## 2012-12-19 DIAGNOSIS — I10 Essential (primary) hypertension: Secondary | ICD-10-CM | POA: Insufficient documentation

## 2012-12-19 DIAGNOSIS — R197 Diarrhea, unspecified: Secondary | ICD-10-CM | POA: Insufficient documentation

## 2012-12-19 DIAGNOSIS — F329 Major depressive disorder, single episode, unspecified: Secondary | ICD-10-CM | POA: Insufficient documentation

## 2012-12-19 DIAGNOSIS — Z7982 Long term (current) use of aspirin: Secondary | ICD-10-CM | POA: Insufficient documentation

## 2012-12-19 DIAGNOSIS — Z79899 Other long term (current) drug therapy: Secondary | ICD-10-CM | POA: Insufficient documentation

## 2012-12-19 DIAGNOSIS — R300 Dysuria: Secondary | ICD-10-CM | POA: Insufficient documentation

## 2012-12-19 DIAGNOSIS — F3289 Other specified depressive episodes: Secondary | ICD-10-CM | POA: Insufficient documentation

## 2012-12-19 DIAGNOSIS — E039 Hypothyroidism, unspecified: Secondary | ICD-10-CM | POA: Insufficient documentation

## 2012-12-19 LAB — URINE MICROSCOPIC-ADD ON

## 2012-12-19 LAB — URINALYSIS, ROUTINE W REFLEX MICROSCOPIC
Glucose, UA: NEGATIVE mg/dL
Hgb urine dipstick: NEGATIVE
Ketones, ur: NEGATIVE mg/dL
Protein, ur: 30 mg/dL — AB
Specific Gravity, Urine: 1.008 (ref 1.005–1.030)
Urobilinogen, UA: 0.2 mg/dL (ref 0.0–1.0)

## 2012-12-19 MED ORDER — PHENAZOPYRIDINE HCL 200 MG PO TABS: 200.0000 mg | ORAL_TABLET | Freq: Three times a day (TID) | ORAL | Status: DC

## 2012-12-19 NOTE — ED Provider Notes (Addendum)
CSN: [id number]     Arrival date & time 12/19/12  1008 History   First MD Initiated Contact with Patient 12/19/12 1030     Chief Complaint  Patient presents with  . Urinary Frequency    HPI  Patient presents with 2 complaints one she has frequency of urination and she states she is uncertain whether or not she has urinary tract infection to his that she had a loose stool this morning. She kept a specimen brought in with her. There is some small dark linear debris in her stool that she was concerned may be worms.  She's not describing a delusion or hallucination of worms or movement and just the appearance of the stool itself. She's been to several medical facility for last several days. She states that "no one can tell me for sure if I have a bladder infection".  Denies hematuria. She denies abdominal pain. She denies fever or back pain. She has a prescription date 19 from a [facility] physician she states she went to today's a fistula to another facility "they were sure if it was a bladder infection or not. Her physician is Dr. Dimple Casey here in [facility]  Past Medical History  Diagnosis Date  . Stomach irritation   . Diverticulitis   . Hypertension   . Depression   . Hypothyroid   . GERD (gastroesophageal reflux disease)    Past Surgical History  Procedure Laterality Date  . Colon resection     Family History  Problem Relation Age of Onset  . Hypertension Sister   . Hyperlipidemia Neg Hx   . Diabetes Neg Hx    History  Substance Use Topics  . Smoking status: Never Smoker   . Smokeless tobacco: Not on file  . Alcohol Use: Not on file   OB History   Grav Para Term Preterm Abortions TAB SAB Ect Mult Living                 Review of Systems  Constitutional: Negative for fever, chills, diaphoresis, appetite change and fatigue.  HENT: Negative for sore throat, mouth sores and trouble swallowing.   Eyes: Negative for visual disturbance.  Respiratory: Negative for cough,  chest tightness, shortness of breath and wheezing.   Cardiovascular: Negative for chest pain.  Gastrointestinal: Positive for diarrhea. Negative for nausea, vomiting, abdominal pain and abdominal distention.  Endocrine: Negative for polydipsia, polyphagia and polyuria.  Genitourinary: Positive for frequency. Negative for dysuria and hematuria.  Musculoskeletal: Negative for gait problem.  Skin: Negative for color change, pallor and rash.  Neurological: Negative for dizziness, syncope, light-headedness and headaches.  Hematological: Does not bruise/bleed easily.  Psychiatric/Behavioral: Negative for behavioral problems and confusion.    Allergies  Ace inhibitors  Home Medications   Current Outpatient Rx  Name  Route  Sig  Dispense  Refill  . amLODipine (NORVASC) 2.5 MG tablet               . aspirin 81 MG tablet   Oral   Take 81 mg by mouth daily.         . cephALEXin (KEFLEX) 500 MG capsule   Oral   Take 1 capsule (500 mg total) by mouth 2 (two) times daily.   20 capsule   0   . clonazePAM (KLONOPIN) 0.5 MG tablet               . CYMBALTA 60 MG capsule               .  FLUoxetine (PROZAC) 20 MG capsule   Oral   Take 20 mg by mouth daily.         Marland Kitchen HYDROcodone-acetaminophen (VICODIN ES) 7.5-750 MG per tablet               . LEVOTHROID 50 MCG tablet               . metoprolol tartrate (LOPRESSOR) 25 MG tablet   Oral   Take 25 mg by mouth 2 (two) times daily.         Marland Kitchen omeprazole (PRILOSEC) 40 MG capsule               . phenazopyridine (PYRIDIUM) 200 MG tablet   Oral   Take 1 tablet (200 mg total) by mouth 3 (three) times daily.   6 tablet   0   . rOPINIRole (REQUIP) 1 MG tablet               . Vitamin D, Ergocalciferol, (DRISDOL) 50000 UNITS CAPS                BP 137/46  Pulse 60  Temp(Src) 98.4 F (36.9 C) (Oral)  Resp 18  SpO2 98% Physical Exam  Constitutional: She is oriented to person, place, and time. She  appears well-developed and well-nourished. No distress.  HENT:  Head: Normocephalic.  Eyes: Conjunctivae are normal. Pupils are equal, round, and reactive to light. No scleral icterus.  Neck: Normal range of motion. Neck supple. No thyromegaly present.  Cardiovascular: Normal rate and regular rhythm.  Exam reveals no gallop and no friction rub.   No murmur heard. Pulmonary/Chest: Effort normal and breath sounds normal. No respiratory distress. She has no wheezes. She has no rales.  Abdominal: Soft. Bowel sounds are normal. She exhibits no distension. There is no tenderness. There is no rebound.  Musculoskeletal: Normal range of motion.  Neurological: She is alert and oriented to person, place, and time.  She can tell me the day date and time. She told that she is in [facility] this morning and now she is" [employer] in [place]. She is ambulatory here. She is quite impatient to be evaluated pacing the hallways. Her behavior is otherwise not in appropriate  Skin: Skin is warm and dry. No rash noted.  Psychiatric: She has a normal mood and affect. Her behavior is normal.    ED Course  Procedures (including critical care time) Labs Review Labs Reviewed  URINALYSIS, ROUTINE W REFLEX MICROSCOPIC - Abnormal; Notable for the following:    Protein, ur 30 (*)    All other components within normal limits  URINE MICROSCOPIC-ADD ON - Abnormal; Notable for the following:    Bacteria, UA FEW (*)    All other components within normal limits  OVA AND PARASITE EXAMINATION  URINE CULTURE   Imaging Review No results found.  MDM   1. Dysuria   2. Diarrhea    Sample that she brought with her does not have obvious parasite and it has been sent a specimen to lab for over an parasite testing. Astra culture of her urine. A bit of time reassuring the patient that her doctor on Tuesday when she sees her follow should be a lot told her about her over and parasite and urine culture. Instruct her  to continue on her antibiotics. Her abdomen is benign here she is awake alert oriented lucid.    Roney Marion, MD 12/19/12 1542  Roney Marion, MD  12/19/12 1543 
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
## 2012-12-19 NOTE — ED Notes (Signed)
Patient reports that she was seen at [facility] this am for possible uti and patient states that she saw worms in stool. Was dicharged without noticing any worms nor UTI. Patient here requesting further evaluation

## 2012-12-19 NOTE — ED Notes (Signed)
Discussed discharge instructions with patient and patient verbalized understanding, contacted patient's son to discuss treatment and follow up plan & also discussed plan with her daughter.

## 2012-12-22 LAB — URINE CULTURE: Colony Count: 40000

## 2012-12-23 NOTE — Progress Notes (Signed)
ED Antimicrobial Stewardship Positive Culture Follow Up   [name] is an 79 y.o. female who presented to [facility] on 12/19/2012 with a chief complaint of dysuria, worms in stool  Chief Complaint  Patient presents with  . Urinary Frequency    Recent Results (from the past 720 hour(s))  URINE CULTURE     Status: None   Collection Time    12/09/12  5:15 AM      Result Value Range Status   Specimen Description URINE, CLEAN CATCH   Final   Special Requests NONE   Final   Culture  Setup Time     Final   Value: 12/09/2012 09:22     Performed at [facility] Count     Final   Value: >=100,000 COLONIES/ML     Performed at [facility]   Culture     Final   Value: ESCHERICHIA COLI     Performed at [facility]   Report Status 12/11/2012 FINAL   Final   Organism ID, Bacteria ESCHERICHIA COLI   Final  URINE CULTURE     Status: None   Collection Time    12/19/12 10:28 AM      Result Value Range Status   Specimen Description URINE, CLEAN CATCH   Final   Special Requests NONE   Final   Culture  Setup Time     Final   Value: 12/20/2012 05:21     Performed at [facility] Count     Final   Value: 40,000 COLONIES/ML     Performed at [facility]   Culture     Final   Value: ENTEROCOCCUS SPECIES     Performed at [facility]   Report Status 12/22/2012 FINAL   Final   Organism ID, Bacteria ENTEROCOCCUS SPECIES   Final    [x]  Patient discharged originally without antimicrobial agent and treatment is now indicated  14 YOF who presented to the [facility] with dysuria and noticing worms in stool. The patient had recently been seen and given Keflex for UTI treatment. Upon this presentation, the patient grew 40k of Enterococcus. Per discussion with the PA-C, will treat since the patient continues to be symptomatic.   New antibiotic prescription: Amoxicillin 500 mg bid x 10 days  ED Provider: Fayrene Helper, PA-C  Rolley Sims 12/23/2012, 9:49 AM Infectious Diseases Pharmacist Phone# [phone]

## 2012-12-23 NOTE — ED Notes (Signed)
Post ED Visit - Positive Culture Follow-up: Successful Patient Follow-Up  Culture assessed and recommendations reviewed by: []  Wes Dulaney, Pharm.D., BCPS []  Celedonio Miyamoto, [address].D., BCPS [x]  Georgina Pillion, Pharm.D., BCPS []  [place], [address].D., BCPS, AAHIVP []  Estella Husk, Pharm.D., BCPS, AAHIVP  Positive urine culture  []  Patient discharged without antimicrobial prescription and treatment is now indicated [x]  Organism is resistant to prescribed ED discharge antimicrobial []  Patient with positive blood cultures  Changes discussed with ED provider: Fayrene Helper New antibiotic prescription Amoxicillin 500 mg BID x 10 days   Contacted patient: LVM  Date 12/23/2012  time 1726  Larena Sox 12/23/2012, 5:07 PM

## 2012-12-26 ENCOUNTER — Telehealth (HOSPITAL_COMMUNITY): Payer: Self-pay | Admitting: Emergency Medicine

## 2012-12-31 ENCOUNTER — Encounter (HOSPITAL_BASED_OUTPATIENT_CLINIC_OR_DEPARTMENT_OTHER): Payer: Self-pay | Admitting: Emergency Medicine

## 2012-12-31 ENCOUNTER — Emergency Department (HOSPITAL_BASED_OUTPATIENT_CLINIC_OR_DEPARTMENT_OTHER)
Admission: EM | Admit: 2012-12-31 | Discharge: 2012-12-31 | Disposition: A | Discharge status: Home / Self Care | Payer: Medicare Other | Attending: Emergency Medicine | Admitting: Emergency Medicine

## 2012-12-31 ENCOUNTER — Emergency Department (HOSPITAL_BASED_OUTPATIENT_CLINIC_OR_DEPARTMENT_OTHER): Payer: Medicare Other

## 2012-12-31 DIAGNOSIS — E039 Hypothyroidism, unspecified: Secondary | ICD-10-CM | POA: Insufficient documentation

## 2012-12-31 DIAGNOSIS — Z792 Long term (current) use of antibiotics: Secondary | ICD-10-CM | POA: Insufficient documentation

## 2012-12-31 DIAGNOSIS — Z7982 Long term (current) use of aspirin: Secondary | ICD-10-CM | POA: Insufficient documentation

## 2012-12-31 DIAGNOSIS — R32 Unspecified urinary incontinence: Secondary | ICD-10-CM | POA: Insufficient documentation

## 2012-12-31 DIAGNOSIS — F329 Major depressive disorder, single episode, unspecified: Secondary | ICD-10-CM | POA: Insufficient documentation

## 2012-12-31 DIAGNOSIS — Z8719 Personal history of other diseases of the digestive system: Secondary | ICD-10-CM | POA: Insufficient documentation

## 2012-12-31 DIAGNOSIS — K219 Gastro-esophageal reflux disease without esophagitis: Secondary | ICD-10-CM | POA: Insufficient documentation

## 2012-12-31 DIAGNOSIS — I10 Essential (primary) hypertension: Secondary | ICD-10-CM | POA: Insufficient documentation

## 2012-12-31 DIAGNOSIS — Z79899 Other long term (current) drug therapy: Secondary | ICD-10-CM | POA: Insufficient documentation

## 2012-12-31 DIAGNOSIS — F3289 Other specified depressive episodes: Secondary | ICD-10-CM | POA: Insufficient documentation

## 2012-12-31 DIAGNOSIS — R350 Frequency of micturition: Secondary | ICD-10-CM | POA: Insufficient documentation

## 2012-12-31 LAB — URINALYSIS, ROUTINE W REFLEX MICROSCOPIC
Bilirubin Urine: NEGATIVE
Glucose, UA: NEGATIVE mg/dL
Hgb urine dipstick: NEGATIVE
Ketones, ur: NEGATIVE mg/dL
Protein, ur: NEGATIVE mg/dL

## 2012-12-31 MED ORDER — ACETAMINOPHEN 325 MG PO TABS
650.0000 mg | ORAL_TABLET | Freq: Once | ORAL | Status: AC
  Administered 2012-12-31: 650 mg via ORAL
  Filled 2012-12-31: qty 2
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
## 2012-12-31 NOTE — ED Notes (Signed)
Pt awaiting transport by [facility] back to independent living facility. Pt called out c/o lower legs cramping. No cramps noted to either calf.

## 2012-12-31 NOTE — ED Notes (Addendum)
Pt sts she has been incontinent. She sts that she has a UTI "all the time". Pt sts she was recently discharged [facility] "because I saw things in my stool." Pt c/o bilat leg pain earlier but not now and sts the pain is not new.

## 2012-12-31 NOTE — ED Provider Notes (Addendum)
CSN: [id number]     Arrival date & time 12/31/12  0405 History   First MD Initiated Contact with Patient 12/31/12 0406     Chief Complaint  Patient presents with  . Dysuria   (Consider location/radiation/quality/duration/timing/severity/associated sxs/prior Treatment) Patient is a 79 y.o. female presenting with dysuria.  Dysuria Pain quality:  Aching Pain severity:  Mild Onset quality:  Gradual Timing:  Constant Progression:  Unchanged Chronicity:  Chronic Recent urinary tract infections: yes   Relieved by:  Nothing Worsened by:  Nothing tried Ineffective treatments:  None tried Urinary symptoms: frequent urination and incontinence   Urinary symptoms: no discolored urine   Associated symptoms: no abdominal pain   Risk factors: no hx of pyelonephritis     Past Medical History  Diagnosis Date  . Stomach irritation   . Diverticulitis   . Hypertension   . Depression   . Hypothyroid   . GERD (gastroesophageal reflux disease)    Past Surgical History  Procedure Laterality Date  . Colon resection     Family History  Problem Relation Age of Onset  . Hypertension Sister   . Hyperlipidemia Neg Hx   . Diabetes Neg Hx    History  Substance Use Topics  . Smoking status: Never Smoker   . Smokeless tobacco: Not on file  . Alcohol Use: Not on file   OB History   Grav Para Term Preterm Abortions TAB SAB Ect Mult Living                 Review of Systems  Gastrointestinal: Negative for abdominal pain.  Genitourinary: Positive for dysuria.  All other systems reviewed and are negative.    Allergies  Ace inhibitors  Home Medications   Current Outpatient Rx  Name  Route  Sig  Dispense  Refill  . amLODipine (NORVASC) 2.5 MG tablet               . aspirin 81 MG tablet   Oral   Take 81 mg by mouth daily.         . cephALEXin (KEFLEX) 500 MG capsule   Oral   Take 1 capsule (500 mg total) by mouth 2 (two) times daily.   20 capsule   0   . clonazePAM  (KLONOPIN) 0.5 MG tablet               . CYMBALTA 60 MG capsule               . FLUoxetine (PROZAC) 20 MG capsule   Oral   Take 20 mg by mouth daily.         Marland Kitchen HYDROcodone-acetaminophen (VICODIN ES) 7.5-750 MG per tablet               . LEVOTHROID 50 MCG tablet               . metoprolol tartrate (LOPRESSOR) 25 MG tablet   Oral   Take 25 mg by mouth 2 (two) times daily.         Marland Kitchen omeprazole (PRILOSEC) 40 MG capsule               . phenazopyridine (PYRIDIUM) 200 MG tablet   Oral   Take 1 tablet (200 mg total) by mouth 3 (three) times daily.   6 tablet   0   . rOPINIRole (REQUIP) 1 MG tablet               . Vitamin D, Ergocalciferol, (DRISDOL)  50000 UNITS CAPS                BP 183/71  Pulse 78  Temp(Src) 98.2 F (36.8 C) (Oral)  Resp 18  Ht 4\' 10"  (1.473 m)  Wt 124 lb (56.246 kg)  BMI 25.92 kg/m2  SpO2 100% Physical Exam  Constitutional: She appears well-developed and well-nourished. No distress.  HENT:  Head: Normocephalic and atraumatic. Head is without raccoon's eyes and without Battle's sign.  Right Ear: External ear normal. No hemotympanum.  Left Ear: External ear normal. No hemotympanum.  Mouth/Throat: Oropharynx is clear and moist.  Eyes: Conjunctivae are normal. Pupils are equal, round, and reactive to light.  Neck: Normal range of motion. Neck supple.  Cardiovascular: Normal rate, regular rhythm and intact distal pulses.   Pulmonary/Chest: Effort normal and breath sounds normal. She has no wheezes. She has no rales.  Abdominal: Soft. Bowel sounds are normal. There is no tenderness. There is no rebound and no guarding.  Pelvis is stable  Musculoskeletal: Normal range of motion.  FROM of B lower extremities both actively and passively no foreshortening nor rotation of the hips.    Neurological: She is alert. She has normal reflexes.  Skin: Skin is warm and dry.  Psychiatric: She has a normal mood and affect.    ED Course   Procedures (including critical care time) Labs Review Labs Reviewed  URINALYSIS, ROUTINE W REFLEX MICROSCOPIC   Imaging Review No results found.  EKG Interpretation   None       MDM  No diagnosis found. Symptoms are like stress incontinence.  No signs of infection will d/c home with PMD follow up   Chauncey Bruno K Chantele Corado-Rasch, MD 12/31/12 0514  Christina Gintz K Kymia Simi-Rasch, MD 12/31/12 [phone]

## 2012-12-31 NOTE — ED Notes (Signed)
Assisted Pt with bedside commode and unable to obtained a urine sample... Will try later after Pt drinks some water.

## 2013-05-17 ENCOUNTER — Encounter (HOSPITAL_COMMUNITY): Payer: Self-pay | Admitting: Emergency Medicine

## 2013-05-17 ENCOUNTER — Emergency Department (HOSPITAL_COMMUNITY)
Admission: EM | Admit: 2013-05-17 | Discharge: 2013-05-19 | Disposition: A | Discharge status: Other Acute Inpatient Hospital | Payer: Medicare Other | Attending: Emergency Medicine | Admitting: Emergency Medicine

## 2013-05-17 DIAGNOSIS — R45851 Suicidal ideations: Secondary | ICD-10-CM | POA: Diagnosis present

## 2013-05-17 DIAGNOSIS — E039 Hypothyroidism, unspecified: Secondary | ICD-10-CM | POA: Insufficient documentation

## 2013-05-17 DIAGNOSIS — F329 Major depressive disorder, single episode, unspecified: Secondary | ICD-10-CM | POA: Diagnosis not present

## 2013-05-17 DIAGNOSIS — I10 Essential (primary) hypertension: Secondary | ICD-10-CM | POA: Diagnosis not present

## 2013-05-17 DIAGNOSIS — K219 Gastro-esophageal reflux disease without esophagitis: Secondary | ICD-10-CM | POA: Insufficient documentation

## 2013-05-17 DIAGNOSIS — Z79899 Other long term (current) drug therapy: Secondary | ICD-10-CM | POA: Diagnosis not present

## 2013-05-17 HISTORY — DX: Personal history of other diseases of the digestive system: Z87.19

## 2013-05-17 HISTORY — DX: Noninfective gastroenteritis and colitis, unspecified: K52.9

## 2013-05-17 HISTORY — DX: Opioid abuse, uncomplicated: F11.10

## 2013-05-17 LAB — URINALYSIS, ROUTINE W REFLEX MICROSCOPIC
BILIRUBIN URINE: NEGATIVE
Glucose, UA: NEGATIVE mg/dL
HGB URINE DIPSTICK: NEGATIVE
Ketones, ur: NEGATIVE mg/dL
Leukocytes, UA: NEGATIVE
Nitrite: NEGATIVE
Protein, ur: NEGATIVE mg/dL
Specific Gravity, Urine: 1.015 (ref 1.005–1.030)
UROBILINOGEN UA: 0.2 mg/dL (ref 0.0–1.0)
pH: 7 (ref 5.0–8.0)

## 2013-05-17 LAB — COMPREHENSIVE METABOLIC PANEL
ALBUMIN: 3.8 g/dL (ref 3.5–5.2)
ALT: 23 U/L (ref 0–35)
AST: 25 U/L (ref 0–37)
Alkaline Phosphatase: 74 U/L (ref 39–117)
BILIRUBIN TOTAL: 0.4 mg/dL (ref 0.3–1.2)
BUN: 28 mg/dL — ABNORMAL HIGH (ref 6–23)
CO2: 24 meq/L (ref 19–32)
Calcium: 10 mg/dL (ref 8.4–10.5)
Chloride: 106 mEq/L (ref 96–112)
Creatinine, Ser: 1.35 mg/dL — ABNORMAL HIGH (ref 0.50–1.10)
GFR calc Af Amer: 41 mL/min — ABNORMAL LOW (ref >90)
GFR, EST NON AFRICAN AMERICAN: 35 mL/min — AB (ref >90)
Glucose, Bld: 103 mg/dL — ABNORMAL HIGH (ref 70–99)
Potassium: 4.7 mEq/L (ref 3.7–5.3)
SODIUM: 144 meq/L (ref 137–147)
Total Protein: 7.2 g/dL (ref 6.0–8.3)

## 2013-05-17 LAB — CBC
HCT: 35.9 % — ABNORMAL LOW (ref 36.0–46.0)
Hemoglobin: 11.7 g/dL — ABNORMAL LOW (ref 12.0–15.0)
MCH: 30.2 pg (ref 26.0–34.0)
MCHC: 32.6 g/dL (ref 30.0–36.0)
MCV: 92.5 fL (ref 78.0–100.0)
PLATELETS: 258 10*3/uL (ref 150–400)
RBC: 3.88 MIL/uL (ref 3.87–5.11)
RDW: 13.4 % (ref 11.5–15.5)
WBC: 12.9 10*3/uL — AB (ref 4.0–10.5)

## 2013-05-17 LAB — RAPID URINE DRUG SCREEN, HOSP PERFORMED
Amphetamines: NOT DETECTED
BENZODIAZEPINES: NOT DETECTED
Barbiturates: NOT DETECTED
Cocaine: NOT DETECTED
OPIATES: NOT DETECTED
TETRAHYDROCANNABINOL: NOT DETECTED

## 2013-05-17 LAB — SALICYLATE LEVEL: Salicylate Lvl: <2 mg/dL — ABNORMAL LOW (ref 2.8–20.0)

## 2013-05-17 LAB — ACETAMINOPHEN LEVEL: Acetaminophen (Tylenol), Serum: <15 ug/mL (ref 10–30)

## 2013-05-17 LAB — ETHANOL: Alcohol, Ethyl (B): <11 mg/dL (ref 0–11)

## 2013-05-17 MED ORDER — ONDANSETRON HCL 4 MG PO TABS: 4.0000 mg | ORAL_TABLET | Freq: Three times a day (TID) | ORAL | Status: DC | PRN

## 2013-05-17 MED ORDER — VITAMIN D3 25 MCG (1000 UNIT) PO TABS
2000.0000 [IU] | ORAL_TABLET | Freq: Every day | ORAL | Status: DC
  Administered 2013-05-18 – 2013-05-19 (×2): 2000 [IU] via ORAL
  Filled 2013-05-17 (×3): qty 2

## 2013-05-17 MED ORDER — GABAPENTIN 100 MG PO CAPS: 100.0000 mg | ORAL_CAPSULE | Freq: Two times a day (BID) | ORAL | Status: DC

## 2013-05-17 MED ORDER — LEVOTHYROXINE SODIUM 50 MCG PO TABS
50.0000 ug | ORAL_TABLET | Freq: Every day | ORAL | Status: DC
  Administered 2013-05-18 – 2013-05-19 (×2): 50 ug via ORAL
  Filled 2013-05-17 (×4): qty 1

## 2013-05-17 MED ORDER — ACETAMINOPHEN 325 MG PO TABS
650.0000 mg | ORAL_TABLET | ORAL | Status: DC | PRN
  Administered 2013-05-18: 650 mg via ORAL
  Filled 2013-05-17: qty 2

## 2013-05-17 MED ORDER — MELATONIN 5 MG PO TABS: 5.0000 mg | ORAL_TABLET | Freq: Every evening | ORAL | Status: DC | PRN

## 2013-05-17 MED ORDER — ALUM & MAG HYDROXIDE-SIMETH 200-200-20 MG/5ML PO SUSP: 30.0000 mL | ORAL | Status: DC | PRN

## 2013-05-17 MED ORDER — IBUPROFEN 400 MG PO TABS: 600.0000 mg | ORAL_TABLET | Freq: Three times a day (TID) | ORAL | Status: DC | PRN

## 2013-05-17 MED ORDER — ASPIRIN EC 81 MG PO TBEC
81.0000 mg | DELAYED_RELEASE_TABLET | Freq: Every day | ORAL | Status: DC
  Administered 2013-05-18 – 2013-05-19 (×2): 81 mg via ORAL
  Filled 2013-05-17 (×3): qty 1

## 2013-05-17 MED ORDER — VITAMIN D 50 MCG (2000 UT) PO TABS: 2000.0000 [IU] | ORAL_TABLET | Freq: Every day | ORAL | Status: DC

## 2013-05-17 MED ORDER — GABAPENTIN 100 MG PO CAPS
100.0000 mg | ORAL_CAPSULE | Freq: Every day | ORAL | Status: DC
  Administered 2013-05-18 – 2013-05-19 (×2): 100 mg via ORAL
  Filled 2013-05-17 (×2): qty 1

## 2013-05-17 MED ORDER — IPRATROPIUM BROMIDE 0.03 % NA SOLN
2.0000 | Freq: Three times a day (TID) | NASAL | Status: DC
  Filled 2013-05-17 (×2): qty 30

## 2013-05-17 MED ORDER — LORAZEPAM 1 MG PO TABS
1.0000 mg | ORAL_TABLET | Freq: Three times a day (TID) | ORAL | Status: DC | PRN
  Administered 2013-05-19: 1 mg via ORAL
  Filled 2013-05-17: qty 1

## 2013-05-17 MED ORDER — METOPROLOL TARTRATE 25 MG PO TABS
25.0000 mg | ORAL_TABLET | Freq: Two times a day (BID) | ORAL | Status: DC
  Administered 2013-05-17 – 2013-05-19 (×5): 25 mg via ORAL
  Filled 2013-05-17 (×5): qty 1

## 2013-05-17 MED ORDER — PANTOPRAZOLE SODIUM 40 MG PO TBEC
40.0000 mg | DELAYED_RELEASE_TABLET | Freq: Every day | ORAL | Status: DC
  Administered 2013-05-18 – 2013-05-19 (×2): 40 mg via ORAL
  Filled 2013-05-17 (×4): qty 1

## 2013-05-17 MED ORDER — CIPROFLOXACIN HCL 500 MG PO TABS
500.0000 mg | ORAL_TABLET | Freq: Two times a day (BID) | ORAL | Status: DC
  Administered 2013-05-17 – 2013-05-19 (×4): 500 mg via ORAL
  Filled 2013-05-17 (×4): qty 1

## 2013-05-17 MED ORDER — ASPIRIN 81 MG PO TABS: 81.0000 mg | ORAL_TABLET | Freq: Every day | ORAL | Status: DC

## 2013-05-17 MED ORDER — GABAPENTIN 100 MG PO CAPS
200.0000 mg | ORAL_CAPSULE | Freq: Every day | ORAL | Status: DC
  Administered 2013-05-17 – 2013-05-18 (×2): 200 mg via ORAL
  Filled 2013-05-17 (×4): qty 2

## 2013-05-17 NOTE — BH Assessment (Signed)
Tele Assessment Note   Janice GelinasJuanita R Miller is an 80 y.o. female who [facility] after attempting to end her life via overdose earlier today.  Ms Rico JunkerHovis reports that she has had really bad dreams and hasn't been able to sleep.  She will not say more about the dreams, only that they are disturbing.  She reports feeling so upset and discouraged that she woke up this morning and decided to end her life.  She had pulled her various prescription medications out with the intention of overdosing on them when her physical therapist walked in and caught her.  She denies any previous attempts or gestures or any family history of suicide.  She has worked with a Therapist, sportspsychiatrist once before, and was hospitalized in May 2014 because she thought their were worms and bugs in her food.  She reports she continues to take medication for this and it's no longer a problem.  She denies AVH.  She also denies substance abuse.  Upon assessment, she is alert and oriented x 4 with clear goal directed thought and speech.  She is willing to consent for inpatient treatment.  The patient lives at [facility] [place] in an independent living unit.     Axis I: Depressive Disorder NOS Axis II: Deferred Axis III:  Past Medical History  Diagnosis Date  . Stomach irritation   . Diverticulitis   . Hypertension   . Depression   . Hypothyroid   . GERD (gastroesophageal reflux disease)   . Narcotic abuse     Vicodin - last use 07/2012  . Chronic diarrhea   . H/O small bowel obstruction    Axis IV: problems with access to health care services Axis V: 41-50 serious symptoms  Past Medical History:  Past Medical History  Diagnosis Date  . Stomach irritation   . Diverticulitis   . Hypertension   . Depression   . Hypothyroid   . GERD (gastroesophageal reflux disease)   . Narcotic abuse     Vicodin - last use 07/2012  . Chronic diarrhea   . H/O small bowel obstruction     Past Surgical History  Procedure Laterality Date   . Colon resection      Family History:  Family History  Problem Relation Age of Onset  . Hypertension Sister   . Hyperlipidemia Neg Hx   . Diabetes Neg Hx     Social History:  reports that she has never smoked. She does not have any smokeless tobacco history on file. She reports that she does not drink alcohol. Her drug history is not on file.  Additional Social History:  Alcohol / Drug Use History of alcohol / drug use?: No history of alcohol / drug abuse  CIWA: CIWA-Ar BP: 139/85 mmHg Pulse Rate: 81 COWS:    Allergies:  Allergies  Allergen Reactions  . Ace Inhibitors Cough    Home Medications:  (Not in a hospital admission)  OB/GYN Status:  No LMP recorded. Patient is postmenopausal.  General Assessment Data [facility] Is this a Tele or Face-to-Face Assessment?: Face-to-Face Is this an Initial Assessment or a Re-assessment for this encounter?: Initial Assessment Living Arrangements: Alone (in independent living) Can pt return to current living arrangement?: Yes Admission Status: Voluntary Is patient capable of signing voluntary admission?: Yes Transfer from: Acute Hospital Referral Source: Self/Family/Friend     [facility] Living Arrangements: Alone (in independent living) Name of Psychiatrist: can't remember  Education Status  Is patient currently in school?: No Highest grade of school patient has completed: High School  Risk to self Suicidal Ideation: Yes-Currently Present Suicidal Intent: Yes-Currently Present Is patient at risk for suicide?: Yes Suicidal Plan?: Yes-Currently Present Specify Current Suicidal Plan: overdose on Rx medications Access to Means: Yes Specify Access to Suicidal Means: Rx medication What has been your use of drugs/alcohol within the last 12 months?: denies Previous Attempts/Gestures: No How many times?: 0 Intentional Self Injurious Behavior: None Family Suicide History: No Recent stressful  life event(s): Recent negative physical changes (nightmares, insomnia) Persecutory voices/beliefs?: No Depression: Yes Depression Symptoms: Insomnia;Feeling angry/irritable Substance abuse history and/or treatment for substance abuse?: No Suicide prevention information given to non-admitted patients: Not applicable  Risk to Others Homicidal Ideation: No Thoughts of Harm to Others: No Current Homicidal Intent: No Current Homicidal Plan: No Access to Homicidal Means: No History of harm to others?: No Assessment of Violence: None Noted Does patient have access to weapons?: No Criminal Charges Pending?: No Does patient have a court date: No  Psychosis Hallucinations: None noted Delusions: None noted  Mental Status Report Appear/Hygiene: Other (Comment) (unremarkable) Eye Contact: Good Motor Activity: Freedom of movement Speech: Logical/coherent Level of Consciousness: Alert Mood: Depressed Affect: Apprehensive Anxiety Level: Minimal Thought Processes: Coherent;Relevant Judgement: Impaired Orientation: Person;Place;Time;Situation Obsessive Compulsive Thoughts/Behaviors: None  Cognitive Functioning Concentration: Decreased Memory: Recent Intact;Remote Intact IQ: Average Insight: Fair Impulse Control: Fair Appetite: Fair Weight Loss: 0 Weight Gain: 0 Sleep: Decreased Total Hours of Sleep: 4 Vegetative Symptoms: None  ADLScreening [facility]) Patient's cognitive ability adequate to safely complete daily activities?: Yes Patient able to express need for assistance with ADLs?: Yes Independently performs ADLs?: Yes (appropriate for developmental age)  Prior Inpatient Therapy Prior Inpatient Therapy: Yes Prior Therapy Dates: May 2014 Prior Therapy Facilty/Provider(s): [facility]  Reason for Treatment: Delusional thoughts  Prior Outpatient Therapy Prior Outpatient Therapy: Yes Prior Therapy Dates: May 2014 Prior Therapy Facilty/Provider(s):  cannot remember Reason for Treatment: psychosis  ADL Screening (condition at time of admission) Patient's cognitive ability adequate to safely complete daily activities?: Yes Patient able to express need for assistance with ADLs?: Yes Independently performs ADLs?: Yes (appropriate for developmental age)       Abuse/Neglect Assessment (Assessment to be complete while patient is alone) Physical Abuse: Denies Verbal Abuse: Denies Sexual Abuse: Denies Exploitation of patient/patient's resources: Denies Values / Beliefs Cultural Requests During Hospitalization: None Spiritual Requests During Hospitalization: None   Advance Directives (For Healthcare) Advance Directive: Patient has advance directive, copy not in chart Type of Advance Directive: Healthcare Power of [place];Living will Nutrition Screen- [facility] Adult/WL/AP Patient's home diet: Regular  Additional Information 1:1 In Past 12 Months?: No CIRT Risk: No Elopement Risk: No Does patient have medical clearance?: Yes     Disposition:  Disposition Initial Assessment Completed for this Encounter: Yes Disposition of Patient: Inpatient treatment program Type of inpatient treatment program: Adult  Steward Ros 05/17/2013 8:22 PM

## 2013-05-17 NOTE — ED Notes (Signed)
Notified AC of sitter need for Suicidal plan and thoughts-no sitter available till 3pm

## 2013-05-17 NOTE — BH Assessment (Signed)
[facility] Assessment Progress Note   Spoke with Dr Unknown FoleyWolford, EDP at [facility].  He was not familiar with the patient, as he was not the one who originally saw her, but this writer explained that I had read the pt's information from her chart and the telepsych is scheduled for 19:10.

## 2013-05-17 NOTE — ED Provider Notes (Signed)
CSN: [id number]     Arrival date & [id number] 05/17/13  1235 History   First MD Initiated Contact with Patient 05/17/13 1352     No chief complaint on file.    (Consider location/radiation/quality/duration/timing/severity/associated sxs/prior Treatment) HPI Comments: Patient is an 80 year old female with a past medical history of hypertension, GERD, and narcotic abuse who presents for medical clearance after a suicide attempt this morning. Patient was found by her physician therapist who visits her at the house for daily sessions. When the physical therapist got the house, the patient had found "a bunch of pills" was putting them all in her mouth. Patient reports waking up this morning and didn't feel like living. She was weaned off of Vicodin 10 months ago and has not taken Narcotics since then. Patient denies a history of depression and anxiety. She denies any recent drug or alcohol abuse. Patient denies HI.    Past Medical History  Diagnosis Date  . Stomach irritation   . Diverticulitis   . Hypertension   . Depression   . Hypothyroid   . GERD (gastroesophageal reflux disease)    Past Surgical History  Procedure Laterality Date  . Colon resection     Family History  Problem Relation Age of Onset  . Hypertension Sister   . Hyperlipidemia Neg Hx   . Diabetes Neg Hx    History  Substance Use Topics  . Smoking status: Never Smoker   . Smokeless tobacco: Not on file  . Alcohol Use: No   OB History   Grav Para Term Preterm Abortions TAB SAB Ect Mult Living                 Review of Systems  Constitutional: Negative for fever, chills and fatigue.  HENT: Negative for trouble swallowing.   Eyes: Negative for visual disturbance.  Respiratory: Negative for shortness of breath.   Cardiovascular: Negative for chest pain and palpitations.  Gastrointestinal: Negative for nausea, vomiting, abdominal pain and diarrhea.  Genitourinary: Negative for dysuria and difficulty urinating.   Musculoskeletal: Negative for arthralgias and neck pain.  Skin: Negative for color change.  Neurological: Negative for dizziness and weakness.  Psychiatric/Behavioral: Positive for suicidal ideas. Negative for dysphoric mood.      Allergies  Ace inhibitors  Home Medications   Current Outpatient Rx  Name  Route  Sig  Dispense  Refill  . aspirin 81 MG tablet   Oral   Take 81 mg by mouth daily.         . Cholecalciferol (VITAMIN D) 2000 UNITS tablet   Oral   Take 2,000 Units by mouth daily.         . ciprofloxacin (CIPRO) 500 MG tablet   Oral   Take 500 mg by mouth 2 (two) times daily.         Marland Kitchen. gabapentin (NEURONTIN) 100 MG capsule   Oral   Take 100-300 mg by mouth 2 (two) times daily. Take 1 capsule in the morning and take 2-3 capsules at bedtime         . ipratropium (ATROVENT) 0.03 % nasal spray   Each Nare   Place 2 sprays into both nostrils 4 (four) times daily.         Marland Kitchen. LEVOTHROID 50 MCG tablet   Oral   Take 50 mcg by mouth daily before breakfast.          . Melatonin ([employer] SLEEP AID) 5 MG TABS   Oral   Take  5 mg by mouth at bedtime as needed (for sleep).         . metoprolol tartrate (LOPRESSOR) 25 MG tablet   Oral   Take 25 mg by mouth 2 (two) times daily.         Marland Kitchen omeprazole (PRILOSEC) 40 MG capsule   Oral   Take 40 mg by mouth daily.           BP 175/61  Pulse 62  Temp(Src) 98.5 F (36.9 C)  Resp 16  Ht 4\' 9"  (1.448 m)  Wt 131 lb 1.6 oz (59.467 kg)  BMI 28.36 kg/m2  SpO2 98% Physical Exam  Nursing note and vitals reviewed. Constitutional: She is oriented to person, place, and time. She appears well-developed and well-nourished. No distress.  HENT:  Head: Normocephalic and atraumatic.  Eyes: Conjunctivae and EOM are normal.  Neck: Normal range of motion.  Cardiovascular: Normal rate and regular rhythm.  Exam reveals no gallop and no friction rub.   No murmur heard. Pulmonary/Chest: Effort normal and breath sounds  normal. She has no wheezes. She has no rales. She exhibits no tenderness.  Abdominal: Soft. She exhibits no distension. There is no tenderness. There is no rebound.  Musculoskeletal: Normal range of motion.  Neurological: She is alert and oriented to person, place, and time. Coordination normal.  Speech is goal-oriented. Moves limbs without ataxia.   Skin: Skin is warm and dry.  Psychiatric: She has a normal mood and affect. Her behavior is normal.    ED Course  Procedures (including critical care time) Labs Review Labs Reviewed  CBC - Abnormal; Notable for the following:    WBC 12.9 (*)    Hemoglobin 11.7 (*)    HCT 35.9 (*)    All other components within normal limits  COMPREHENSIVE METABOLIC PANEL - Abnormal; Notable for the following:    Glucose, Bld 103 (*)    BUN 28 (*)    Creatinine, Ser 1.35 (*)    GFR calc non Af Amer 35 (*)    GFR calc Af Amer 41 (*)    All other components within normal limits  SALICYLATE LEVEL - Abnormal; Notable for the following:    Salicylate Lvl <2.0 (*)    All other components within normal limits  ACETAMINOPHEN LEVEL  ETHANOL  URINE RAPID DRUG SCREEN (HOSP PERFORMED)  URINALYSIS, ROUTINE W REFLEX MICROSCOPIC   Imaging Review No results found.   EKG Interpretation None      MDM   Final diagnoses:  Suicidal ideation    2:36 PM Patient will be evaluated by TTS.     Emilia Beck, [place] 05/17/13 [phone]

## 2013-05-17 NOTE — ED Notes (Signed)
[name] called and stated, "we are currently looking for a geriatric placement for this patient."

## 2013-05-17 NOTE — Progress Notes (Signed)
[name], MHT was introduced to patients son by attending RN, Kriste BasqueBecky. Writer spoke with patients son [name](Richard [employer]ovis) [phone] who is POA and should be consulted in efforts to obtain factual information as patient will be a poor historian. Mr. Janice BurdockRichard reports that patient was doing fine day before and onset of this episode was sudden. Patient has no history of SI or gestures, Mr. Janice BurdockRichard reports that he believes that she is manic depressive. She has been off of psychotropic medications for the past month and has been taking requip 6 tabs qd for the past 2 months. Mr. Janice BurdockRichard reports that when patient is not medicated she experiences high's and low's with racing thoughts, and has a 20 yr hx of vicodine use but has been off of them since last May 2014. Patient had planned to overdose on medications today prior to being brought to ED by her son.

## 2013-05-17 NOTE — ED Notes (Addendum)
Pt in paper scrubs. All belonging given to son, sts "I will put them in the car".

## 2013-05-17 NOTE — ED Notes (Signed)
Pt reports she attempted suicide this morning by getting a bunch of pills together, denies actually taking the pills, she was stopped before she could take them. Has been seeing a psychiatrist over the past 6 months. Pt recently pt switched to a new neurologist and in february had a couple medication changes. Pt sts she thinks she has a UTI, gets them frequently.  Son at side - for the past 20 years pt was taking Vicodin, tried to get her off of it about a year ago and hasn't had any since then but then started to experience some psychotic thoughts, stayed at the [place] [facility] for 7 days then started on medications there.  The medications were causing some parkinson's diease symptoms so they took her to a neurologist and that is why they switched some of the meds, the parkinson's symptom went away. This morning her physical therapist came over and the pt was about to take a handful of pills. Pt has never had SI/HI in the past. Told son that she has been having horrible dreams and its caused her to be SI.

## 2013-05-18 ENCOUNTER — Encounter (HOSPITAL_COMMUNITY): Payer: Self-pay | Admitting: Family

## 2013-05-18 ENCOUNTER — Emergency Department (HOSPITAL_COMMUNITY): Payer: Medicare Other

## 2013-05-18 DIAGNOSIS — F329 Major depressive disorder, single episode, unspecified: Secondary | ICD-10-CM

## 2013-05-18 DIAGNOSIS — T50901A Poisoning by unspecified drugs, medicaments and biological substances, accidental (unintentional), initial encounter: Secondary | ICD-10-CM

## 2013-05-18 DIAGNOSIS — R45851 Suicidal ideations: Secondary | ICD-10-CM | POA: Diagnosis not present

## 2013-05-18 DIAGNOSIS — T50902A Poisoning by unspecified drugs, medicaments and biological substances, intentional self-harm, initial encounter: Secondary | ICD-10-CM

## 2013-05-18 MED ORDER — CITALOPRAM HYDROBROMIDE 10 MG PO TABS
5.0000 mg | ORAL_TABLET | Freq: Every day | ORAL | Status: DC
  Administered 2013-05-18: 5 mg via ORAL
  Filled 2013-05-18: qty 1

## 2013-05-18 MED ORDER — IPRATROPIUM BROMIDE 0.06 % NA SOLN
1.0000 | Freq: Three times a day (TID) | NASAL | Status: DC
  Administered 2013-05-18 – 2013-05-19 (×3): 1 via NASAL
  Filled 2013-05-18: qty 15

## 2013-05-18 NOTE — ED Notes (Signed)
PATIENT SON HAS CALLED AND IS UPSET THAT PT IS BEING TRANSFERRED TO A HOSPITAL THAT IS FAR AWAY. REFERRED HIM TO BH WHERE THEY DO PLACEMENTS.

## 2013-05-18 NOTE — Progress Notes (Addendum)
[facility] requesting chest x-ray and EKG.  Will communicate these requests with pt's nurse.  UPDATE: Spoke with pt's MD, Dr. Judd Lienelo, regarding the requests from [facility].  Stated he has put the requested orders in.   Tomi BambergerMariya Caylor Cerino Disposition MHT

## 2013-05-18 NOTE — Progress Notes (Signed)
[facility] back [facility].  Pt has been declined by Dr. Robet Leuhotakura d/t medical acuity.   Tomi BambergerMariya Jassica Zazueta Disposition MHT

## 2013-05-18 NOTE — BH Assessment (Signed)
Received a call from patient's son requesting information about pt's placement. Writer explained that this information could not be provided without consent or POA/Guardianship documentation. Patient's son faxed the POA documentation to this Clinical research associatewriter. Once received writer contacted patients son to confirm that the documentation was received.   Writer then told patient's son where patient was accepted [facility] Writer explained the disposition process in detail with intent to provide a better understanding of how and why patients are placed at various facilities (not always close to this county). Patients son was ademant that he did not want his [facility] b/c of the distance. He was overall angry/frustrated stating that he was on his way to the hospital to speak with management or a EDP about his [facility] and poor communication to the family about his mothers treatment.   Writer notified EDP-Dr. Freida BusmanAllen of the above information.   Later received a call from Dr. Freida BusmanAllen. Sts that he spoke to the family whom expressed concerns for patient's disposition plan. Says that the family iis concerned about the [facility].  Additionally, they shared concerns for lack of communication in patient's plan of care. Dr. Freida BusmanAllen expressed that patient does not have to be transported to [facility]. Dr. Freida BusmanAllen would like TTS/MHT working on dispositions to try and seek hospitalization for this patient at a closer facility. Writer expressed to Dr. Freida BusmanAllen that patient could potentially remain in the ED for several days until placed elsewhere. Dr. Freida BusmanAllen is ok with this. Dr. Freida BusmanAllen has also requested a TP for this patient. Writer will notify Serena Colonelggie Nwoko to have this completed.  MHT/TTS will try to honor the family's request and find closer placement. Writer has contacted [employer],Rick- Son [phone] and [phone]. Writer left voicemail's for patient's son to return call so that he may be  updated with patient's disposition plan.

## 2013-05-18 NOTE — ED Notes (Signed)
[name]  <son> [phone]

## 2013-05-18 NOTE — ED Notes (Signed)
Pt son has arrived to visit

## 2013-05-18 NOTE — Progress Notes (Signed)
Pt's referral has been faxed to the following facilities:  [facility] per [facility] taking referrals, [facility], [facility]- [facility] per [facility] accepting ref[facility], [facility]- per Pam taking referrals, referral faxed [facility]- [facility], referral faxed   [facility] Disposition MHT

## 2013-05-18 NOTE — ED Notes (Signed)
Dr Freida Busmanallen states now that pt doesn't have to be transferred to [facility]. He is going to tell bh that he wants to change plan at family insistance

## 2013-05-18 NOTE — Progress Notes (Signed)
Per, [facility]ol[facility]athleen at [facility] the patient does meet criteria and they may possibly have a bed for the patient tonight or tomorrow.  Nicholos JohnsKathleen reports that they are waiting on discharges before they can accept the patient.   Writer informed Dr. Freida BusmanAllen and he has requested for [place] to contact the son provide this information so that the family is able to speak to one consistent person.   Writer informed [place].

## 2013-05-18 NOTE — ED Notes (Signed)
SPO[facility] TO [facility]ATHLE[facility]. SHE ADVISES THEY ARE HAVING SOME STAFFING ISSUES THIS EVENING. SHE ALSO ADVISES THAT THE PT IS AT THE TOP OF THE LIST TO GET A BED AT THEIR UNIT. SHE ADVISES THAT IF WE DO NOT GET A CALL TONIGHT TO PLEASE CALL THEM BACK IN THE MORNING.([phone])

## 2013-05-18 NOTE — ED Notes (Signed)
Dr Freida Busmanallen in with charge nurse to talk to pt son.

## 2013-05-18 NOTE — Consult Note (Addendum)
Telepsych Consultation   Reason for Consult:  Pt attempted to OD on medications Referring Physician:  EDP YIZEL CANBY is an 80 y.o. female.  Assessment: AXIS I:  Major Depression, single episode AXIS II:  Deferred AXIS III:   Past Medical History  Diagnosis Date  . Stomach irritation   . Diverticulitis   . Hypertension   . Depression   . Hypothyroid   . GERD (gastroesophageal reflux disease)   . Narcotic abuse     Vicodin - last use 07/2012  . Chronic diarrhea   . H/O small bowel obstruction    AXIS IV:  other psychosocial or environmental problems and problems related to social environment AXIS V:  11-20 some danger of hurting self or others possible OR occasionally fails to maintain minimal personal hygiene OR gross impairment in communication  Plan:  Recommend psychiatric Inpatient admission when medically cleared.  Subjective:   VERNIE VINCIGUERRA is a 80 y.o. female patient presenting to the [facility] s/p attempted overdose of her home medications (halted by physical therapist). Pt is alert, oriented x4, and in NAD. Answers questions appropriately, consistently, with posturing consistent with speech. Pt rates anxiety and depression both at 0/10, stating that she does not have chronic anxiety or depression, that this was an isolated episode only related to her "awful dreams". Pt reports feeling "off" since the onset of UTI symptoms, for which she is currently be treated with Cipro. Pt reports that her mind feels more clear already since initiation of antibiotic therapy. Pt did not want to discuss her dreams in detail. Pt described feeling overwhelmed by the intensity of the dreams and that it was "too much to handle". Pt is in agreement with inpatient geriatric psychiatric hospitalization (likely temporary as this may be exacerbated by UTI), and with initiation of low dose Celexa at 44m daily at bedtime.    HPI:  JBRAILYNN BRETHis an 80y.o. female [facility]. Ms HZuritareports that she has had really bad dreams and hasn't been able to sleep. She will not say more about the dreams, only that they are disturbing. She reports feeling so upset and discouraged that she woke up this morning and decided to end her life. She had pulled her various prescription medications out with the intention of overdosing on them when her physical therapist walked in and caught her. She denies any previous attempts or gestures or any family history of suicide. 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 She reports she continues to take medication for this and it's no longer a problem. She denies AVH. She also denies substance abuse. Upon assessment, she is alert and oriented x 4 with clear goal directed thought and speech. She is willing to consent for inpatient treatment. [facility]   HPI Elements:   Location:  Generalized, Inpatient [facility]. Quality:  Stable, improving. Severity:  Severe. Timing:  Constant. Duration:  Acute onset. Context:  Feelings of being overwhelmed by bad dreams.  Past Psychiatric History: Past Medical History  Diagnosis Date  . Stomach irritation   . Diverticulitis   . Hypertension   . Depression   . Hypothyroid   . GERD (gastroesophageal reflux disease)   . Narcotic abuse     Vicodin -  last use 07/2012  . Chronic diarrhea   . H/O small bowel obstruction     reports that she has never smoked. She does not have any smokeless tobacco history on file. She reports that she does not drink alcohol. Her drug history is not on file. Family History  Problem Relation Age of Onset  . Hypertension Sister   . Hyperlipidemia Neg Hx   . Diabetes Neg Hx    Family History Substance Abuse: No Family Supports: Yes, List: (son is an Forensic psychologist in  town) Living Arrangements: Alone (in independent living) Can pt return to current living arrangement?: Yes Allergies:   Allergies  Allergen Reactions  . Ace Inhibitors Cough    ACT Assessment Complete:  Yes:    Educational Status    Risk to Self: Risk to self Suicidal Ideation: Yes-Currently Present Suicidal Intent: Yes-Currently Present Is patient at risk for suicide?: Yes Suicidal Plan?: Yes-Currently Present Specify Current Suicidal Plan: overdose on Rx medications Access to Means: Yes Specify Access to Suicidal Means: Rx medication What has been your use of drugs/alcohol within the last 12 months?: denies Previous Attempts/Gestures: No How many times?: 0 Intentional Self Injurious Behavior: None Family Suicide History: No Recent stressful life event(s): Recent negative physical changes (nightmares, insomnia) Persecutory voices/beliefs?: No Depression: Yes Depression Symptoms: Insomnia;Feeling angry/irritable Substance abuse history and/or treatment for substance abuse?: Yes (Vicodin (last use May 2014)) Suicide prevention information given to non-admitted patients: Not applicable  Risk to Others: Risk to Others Homicidal Ideation: No Thoughts of Harm to Others: No Current Homicidal Intent: No Current Homicidal Plan: No Access to Homicidal Means: No History of harm to others?: No Assessment of Violence: None Noted Does patient have access to weapons?: No Criminal Charges Pending?: No Does patient have a court date: No  Abuse: Abuse/Neglect Assessment (Assessment to be complete while patient is alone) Physical Abuse: Denies Verbal Abuse: Denies Sexual Abuse: Denies Exploitation of patient/patient's resources: Denies  Prior Inpatient Therapy: Prior Inpatient Therapy Prior Inpatient Therapy: Yes Prior Therapy Dates: May 2014 Prior Therapy Facilty/Provider(s): [facility]  Reason for Treatment: Delusional thoughts  Prior Outpatient Therapy: Prior Outpatient  Therapy Prior Outpatient Therapy: Yes Prior Therapy Dates: May 2014 Prior Therapy Facilty/Provider(s): cannot remember Reason for Treatment: psychosis  Additional Information: Additional Information 1:1 In Past 12 Months?: No CIRT Risk: No Elopement Risk: No Does patient have medical clearance?: Yes                  Objective: Blood pressure 178/82, pulse 65, temperature 98.7 F (37.1 C), temperature source Oral, resp. rate 16, height 4' 9"  (1.448 m), weight 59.467 kg (131 lb 1.6 oz), SpO2 97.00%.Body mass index is 28.36 kg/(m^2). Results for orders placed during the hospital encounter of 05/17/13 (from the past 72 hour(s))  ACETAMINOPHEN LEVEL     Status: None   Collection Time    05/17/13  1:14 PM      Result Value Ref Range   Acetaminophen (Tylenol), Serum <15.0  10 - 30 ug/mL   Comment:            THERAPEUTIC CONCENTRATIONS VARY     SIGNIFICANTLY. A RANGE OF 10-30     ug/mL MAY BE AN EFFECTIVE     CONCENTRATION FOR MANY PATIENTS.     HOWEVER, SOME ARE BEST TREATED     AT CONCENTRATIONS OUTSIDE THIS     RANGE.     ACETAMINOPHEN CONCENTRATIONS     >150 ug/mL AT 4 HOURS AFTER  INGESTION AND >50 ug/mL AT 12     HOURS AFTER INGESTION ARE     OFTEN ASSOCIATED WITH TOXIC     REACTIONS.  CBC     Status: Abnormal   Collection Time    05/17/13  1:14 PM      Result Value Ref Range   WBC 12.9 (*) 4.0 - 10.5 K/uL   RBC 3.88  3.87 - 5.11 MIL/uL   Hemoglobin 11.7 (*) 12.0 - 15.0 g/dL   HCT 35.9 (*) 36.0 - 46.0 %   MCV 92.5  78.0 - 100.0 fL   MCH 30.2  26.0 - 34.0 pg   MCHC 32.6  30.0 - 36.0 g/dL   RDW 13.4  11.5 - 15.5 %   Platelets 258  150 - 400 K/uL  COMPREHENSIVE METABOLIC PANEL     Status: Abnormal   Collection Time    05/17/13  1:14 PM      Result Value Ref Range   Sodium 144  137 - 147 mEq/L   Potassium 4.7  3.7 - 5.3 mEq/L   Chloride 106  96 - 112 mEq/L   CO2 24  19 - 32 mEq/L   Glucose, Bld 103 (*) 70 - 99 mg/dL   BUN 28 (*) 6 - 23 mg/dL    Creatinine, Ser 1.35 (*) 0.50 - 1.10 mg/dL   Calcium 10.0  8.4 - 10.5 mg/dL   Total Protein 7.2  6.0 - 8.3 g/dL   Albumin 3.8  3.5 - 5.2 g/dL   AST 25  0 - 37 U/L   ALT 23  0 - 35 U/L   Alkaline Phosphatase 74  39 - 117 U/L   Total Bilirubin 0.4  0.3 - 1.2 mg/dL   GFR calc non Af Amer 35 (*) >90 mL/min   GFR calc Af Amer 41 (*) >90 mL/min   Comment: (NOTE)     The eGFR has been calculated using the CKD EPI equation.     This calculation has not been validated in all clinical situations.     eGFR's persistently <90 mL/min signify possible Chronic Kidney     Disease.  ETHANOL     Status: None   Collection Time    05/17/13  1:14 PM      Result Value Ref Range   Alcohol, Ethyl (B) <11  0 - 11 mg/dL   Comment:            LOWEST DETECTABLE LIMIT FOR     SERUM ALCOHOL IS 11 mg/dL     FOR MEDICAL PURPOSES ONLY  SALICYLATE LEVEL     Status: Abnormal   Collection Time    05/17/13  1:14 PM      Result Value Ref Range   Salicylate Lvl <2.0 (*) 2.8 - 20.0 mg/dL  URINE RAPID DRUG SCREEN (HOSP PERFORMED)     Status: None   Collection Time    05/17/13  2:50 PM      Result Value Ref Range   Opiates NONE DETECTED  NONE DETECTED   Cocaine NONE DETECTED  NONE DETECTED   Benzodiazepines NONE DETECTED  NONE DETECTED   Amphetamines NONE DETECTED  NONE DETECTED   Tetrahydrocannabinol NONE DETECTED  NONE DETECTED   Barbiturates NONE DETECTED  NONE DETECTED   Comment:            DRUG SCREEN FOR MEDICAL PURPOSES     ONLY.  IF CONFIRMATION IS NEEDED     FOR ANY PURPOSE, NOTIFY LAB  WITHIN 5 DAYS.                LOWEST DETECTABLE LIMITS     FOR URINE DRUG SCREEN     Drug Class       Cutoff (ng/mL)     Amphetamine      1000     Barbiturate      200     Benzodiazepine   951     Tricyclics       884     Opiates          300     Cocaine          300     THC              50  URINALYSIS, ROUTINE W REFLEX MICROSCOPIC     Status: None   Collection Time    05/17/13  2:50 PM      Result Value  Ref Range   Color, Urine YELLOW  YELLOW   APPearance CLEAR  CLEAR   Specific Gravity, Urine 1.015  1.005 - 1.030   pH 7.0  5.0 - 8.0   Glucose, UA NEGATIVE  NEGATIVE mg/dL   Hgb urine dipstick NEGATIVE  NEGATIVE   Bilirubin Urine NEGATIVE  NEGATIVE   Ketones, ur NEGATIVE  NEGATIVE mg/dL   Protein, ur NEGATIVE  NEGATIVE mg/dL   Urobilinogen, UA 0.2  0.0 - 1.0 mg/dL   Nitrite NEGATIVE  NEGATIVE   Leukocytes, UA NEGATIVE  NEGATIVE   Comment: MICROSCOPIC NOT DONE ON URINES WITH NEGATIVE PROTEIN, BLOOD, LEUKOCYTES, NITRITE, OR GLUCOSE <1000 mg/dL.   Labs are reviewed and are pertinent for: BUN 28, Cr 1.35, WBC 12.9.   Current Facility-Administered Medications  Medication Dose Route Frequency Provider Last Rate Last Dose  . acetaminophen (TYLENOL) tablet 650 mg  650 mg Oral Q4H PRN Alvina Chou, PA-C   650 mg at 05/18/13 1535  . alum & mag hydroxide-simeth (MAALOX/MYLANTA) 200-200-20 MG/5ML suspension 30 mL  30 mL Oral PRN Alvina Chou, PA-C      . aspirin EC tablet 81 mg  81 mg Oral Daily Mirna Mires, MD   81 mg at 05/18/13 1007  . cholecalciferol (VITAMIN D) tablet 2,000 Units  2,000 Units Oral Daily Mirna Mires, MD   2,000 Units at 05/18/13 1007  . ciprofloxacin (CIPRO) tablet 500 mg  500 mg Oral BID Alvina Chou, PA-C   500 mg at 05/18/13 0740  . citalopram (CELEXA) tablet 5 mg  5 mg Oral Daily Benjamine Mola, FNP      . gabapentin (NEURONTIN) capsule 100 mg  100 mg Oral Daily Mirna Mires, MD   100 mg at 05/18/13 1007  . gabapentin (NEURONTIN) capsule 200 mg  200 mg Oral QHS Mirna Mires, MD   200 mg at 05/17/13 2200  . ibuprofen (ADVIL,MOTRIN) tablet 600 mg  600 mg Oral Q8H PRN Alvina Chou, PA-C      . ipratropium (ATROVENT) 0.06 % nasal spray 1 spray  1 spray Each Nare TID Alvina Chou, PA-C   1 spray at 05/18/13 1535  . levothyroxine (SYNTHROID, LEVOTHROID) tablet 50 mcg  50 mcg Oral QAC breakfast Alvina Chou, PA-C   50 mcg at 05/18/13 0609  .  LORazepam (ATIVAN) tablet 1 mg  1 mg Oral Q8H PRN Kaitlyn Szekalski, PA-C      . metoprolol tartrate (LOPRESSOR) tablet 25 mg  25 mg Oral BID Alvina Chou, PA-C   25  mg at 05/18/13 1008  . ondansetron (ZOFRAN) tablet 4 mg  4 mg Oral Q8H PRN Kaitlyn Szekalski, PA-C      . pantoprazole (PROTONIX) EC tablet 40 mg  40 mg Oral Daily Kaitlyn Szekalski, PA-C   40 mg at 05/18/13 1048   Current Outpatient Prescriptions  Medication Sig Dispense Refill  . aspirin 81 MG tablet Take 81 mg by mouth daily.      . Cholecalciferol (VITAMIN D) 2000 UNITS tablet Take 2,000 Units by mouth daily.      . ciprofloxacin (CIPRO) 500 MG tablet Take 500 mg by mouth 2 (two) times daily.      Marland Kitchen gabapentin (NEURONTIN) 100 MG capsule Take 100-300 mg by mouth 2 (two) times daily. Take 1 capsule in the morning and take 2-3 capsules at bedtime      . ipratropium (ATROVENT) 0.03 % nasal spray Place 2 sprays into both nostrils 4 (four) times daily.      Marland Kitchen LEVOTHROID 50 MCG tablet Take 50 mcg by mouth daily before breakfast.       . Melatonin ([employer] SLEEP AID) 5 MG TABS Take 5 mg by mouth at bedtime as needed (for sleep).      . metoprolol tartrate (LOPRESSOR) 25 MG tablet Take 25 mg by mouth 2 (two) times daily.      Marland Kitchen omeprazole (PRILOSEC) 40 MG capsule Take 40 mg by mouth daily.         Psychiatric Specialty Exam:     Blood pressure 178/82, pulse 65, temperature 98.7 F (37.1 C), temperature source Oral, resp. rate 16, height 4' 9"  (1.448 m), weight 59.467 kg (131 lb 1.6 oz), SpO2 97.00%.Body mass index is 28.36 kg/(m^2).  General Appearance: Casual  Eye Contact::  Good  Speech:  Clear and Coherent  Volume:  Normal  Mood:  Euthymic  Affect:  Appropriate  Thought Process:  Coherent  Orientation:  Full (Time, Place, and Person)  Thought Content:  WDL  Suicidal Thoughts:  No  Homicidal Thoughts:  No  Memory:  Immediate;   Good Recent;   Good Remote;   Good  Judgement:  Good  Insight:  Good  Psychomotor  Activity:  Normal  Concentration:  Good  Recall:  Good  Akathisia:  NA  Handed:  Right  AIMS (if indicated):     Assets:  Communication Skills Desire for Improvement Physical Health Resilience Social Support  Sleep:      Treatment Plan Summary: Medication Management: Celexa 95m daily at bedtime.   Disposition: Transfer to inpatient geriatric psychiatric hospital unit. Pending placement at local facility [facility] ED Nursing staff: Please notify family members listed in chart when patient is preparing to transport; pt has consented to this dissemination of information.   Disposition Initial Assessment Completed for this Encounter: Yes Disposition of Patient: Inpatient treatment program Type of inpatient treatment program: Adult  WBenjamine Mola FNP-BC 05/18/2013 4:20 PM   Reviewed the information documented and agree with the treatment plan.  Berdella Bacot,JANARDHAHA R. 05/19/2013 9:03 AM

## 2013-05-18 NOTE — ED Provider Notes (Signed)
Patient seen by me today and continues to endorse suicidal ideations. I've spoken with the patient's son and daughter at length and they are very upset that there was lack of communication concerning the  patient's psychiatric placement 3 hours from here. I have contacted social work as well have spoken with T[facility] and they are working on alternative placement. Patient had the IVC placed by me for transportation to a facility  Toy BakerAnthony T Dezyre Hoefer, MD 05/18/13 1348

## 2013-05-18 NOTE — Progress Notes (Signed)
[facility]  The accepting doctor is Nicola GirtBelinda Veaser.  The patients room number is 319.  The number for report is [phone].    Due to the patient threatening to kill herself she will be IVC.  Writer to the nurse Drinda Butts(Annette) who will take care of the IVC paperwork. The nurse will will arrange transportation through [place].    Writer informed the ER MD Dr. Freida BusmanAllen

## 2013-05-18 NOTE — ED Provider Notes (Signed)
Medical screening examination/treatment/procedure(s) were conducted as a shared visit with non-physician practitioner(s) and myself.  I personally evaluated the patient during the encounter.   EKG Interpretation None      Pt with hx depression. Increasingly depressed +SI.  Pt alert, content, depressed mood. Labs. Psych team eval.   Suzi RootsKevin E Bernardette Waldron, MD 05/18/13 [phone]

## 2013-05-18 NOTE — ED Notes (Signed)
[facility]

## 2013-05-18 NOTE — ED Notes (Signed)
[employer] HAS SERVED PAPERS.

## 2013-05-19 DIAGNOSIS — R45851 Suicidal ideations: Secondary | ICD-10-CM | POA: Diagnosis not present

## 2013-05-19 NOTE — Progress Notes (Signed)
Per, Victorino DikeJennifer the patient is [facility].  The number to call report is [phone].  Room 424-B.  Dr. Prudencio PairLowry is the accepting doctor.   The patient's legal guardian is her son, Nancie Neas(Rick Kirschenmann) and he wants to be informed of his mother status once she has been accepted.  His phone number is [phone] cell or [phone] home.   Writer informed the son and he is aware that his [place] [facility].  The son requested that the nurse inform his mother that he is aware that [place] [facility] and he will come to visit her there at that hospital.

## 2013-05-19 NOTE — ED Notes (Signed)
SPOKE TO SHERRIFF PASCHAL AND HE ADVISES TO GO AHEAD AND GET PT READY TO GO THAT THEY WILL BE ON THE WAY

## 2013-05-19 NOTE — ED Notes (Signed)
CALLED PT SON RICK PER PT REQUEST AND LET HIM KNOW TRANSPORT PENDING TO [facility]. HE WAS GIVEN THE NUMBER TO [facility] SO HE CAN CALL TO GET INFO

## 2013-05-19 NOTE — Progress Notes (Signed)
The nurse has arranged [facility]am  Writer informed the ER MD Dr. Ranae PalmsYelverton.

## 2013-05-19 NOTE — ED Notes (Signed)
SHERRIFF HAS ARRIVED TO TRANSPORT 

## 2013-05-19 NOTE — Progress Notes (Signed)
[facility] been [facility], [facility]. Pt.'s son informed.   [address], [place] [phone]

## 2014-01-10 ENCOUNTER — Encounter (HOSPITAL_BASED_OUTPATIENT_CLINIC_OR_DEPARTMENT_OTHER): Payer: Self-pay | Admitting: Emergency Medicine

## 2014-01-10 ENCOUNTER — Emergency Department (HOSPITAL_BASED_OUTPATIENT_CLINIC_OR_DEPARTMENT_OTHER)
Admission: EM | Admit: 2014-01-10 | Discharge: 2014-01-10 | Disposition: A | Discharge status: Home / Self Care | Payer: Medicare Other | Attending: Emergency Medicine | Admitting: Emergency Medicine

## 2014-01-10 DIAGNOSIS — G4700 Insomnia, unspecified: Secondary | ICD-10-CM | POA: Diagnosis not present

## 2014-01-10 DIAGNOSIS — Z792 Long term (current) use of antibiotics: Secondary | ICD-10-CM | POA: Insufficient documentation

## 2014-01-10 DIAGNOSIS — F329 Major depressive disorder, single episode, unspecified: Secondary | ICD-10-CM | POA: Insufficient documentation

## 2014-01-10 DIAGNOSIS — F419 Anxiety disorder, unspecified: Secondary | ICD-10-CM | POA: Diagnosis not present

## 2014-01-10 DIAGNOSIS — E039 Hypothyroidism, unspecified: Secondary | ICD-10-CM | POA: Diagnosis not present

## 2014-01-10 DIAGNOSIS — R52 Pain, unspecified: Secondary | ICD-10-CM | POA: Insufficient documentation

## 2014-01-10 DIAGNOSIS — N289 Disorder of kidney and ureter, unspecified: Secondary | ICD-10-CM | POA: Insufficient documentation

## 2014-01-10 DIAGNOSIS — Z87440 Personal history of urinary (tract) infections: Secondary | ICD-10-CM | POA: Diagnosis not present

## 2014-01-10 DIAGNOSIS — Z79899 Other long term (current) drug therapy: Secondary | ICD-10-CM | POA: Diagnosis not present

## 2014-01-10 DIAGNOSIS — I10 Essential (primary) hypertension: Secondary | ICD-10-CM | POA: Diagnosis not present

## 2014-01-10 DIAGNOSIS — K219 Gastro-esophageal reflux disease without esophagitis: Secondary | ICD-10-CM | POA: Insufficient documentation

## 2014-01-10 HISTORY — DX: Anxiety disorder, unspecified: F41.9

## 2014-01-10 HISTORY — DX: Pain, unspecified: R52

## 2014-01-10 HISTORY — DX: Acute kidney failure, unspecified: N17.9

## 2014-01-10 HISTORY — DX: Other malaise: R53.81

## 2014-01-10 HISTORY — DX: Urinary tract infection, site not specified: N39.0

## 2014-01-10 HISTORY — DX: Insomnia, unspecified: G47.00

## 2014-01-10 HISTORY — DX: Unspecified mood (affective) disorder: F39

## 2014-01-10 LAB — COMPREHENSIVE METABOLIC PANEL
ALBUMIN: 4 g/dL (ref 3.5–5.2)
ALT: 9 U/L (ref 0–35)
AST: 20 U/L (ref 0–37)
Alkaline Phosphatase: 86 U/L (ref 39–117)
Anion gap: 17 — ABNORMAL HIGH (ref 5–15)
BUN: 49 mg/dL — ABNORMAL HIGH (ref 6–23)
CO2: 20 mEq/L (ref 19–32)
Calcium: 10 mg/dL (ref 8.4–10.5)
Chloride: 101 mEq/L (ref 96–112)
Creatinine, Ser: 2.8 mg/dL — ABNORMAL HIGH (ref 0.50–1.10)
GFR calc Af Amer: 17 mL/min — ABNORMAL LOW (ref >90)
GFR calc non Af Amer: 14 mL/min — ABNORMAL LOW (ref >90)
Glucose, Bld: 104 mg/dL — ABNORMAL HIGH (ref 70–99)
Potassium: 5.1 mEq/L (ref 3.7–5.3)
SODIUM: 138 meq/L (ref 137–147)
TOTAL PROTEIN: 7.6 g/dL (ref 6.0–8.3)
Total Bilirubin: 0.3 mg/dL (ref 0.3–1.2)

## 2014-01-10 LAB — URINALYSIS, ROUTINE W REFLEX MICROSCOPIC
Bilirubin Urine: NEGATIVE
GLUCOSE, UA: NEGATIVE mg/dL
Ketones, ur: NEGATIVE mg/dL
Nitrite: NEGATIVE
PH: 5.5 (ref 5.0–8.0)
Protein, ur: NEGATIVE mg/dL
SPECIFIC GRAVITY, URINE: 1.017 (ref 1.005–1.030)
UROBILINOGEN UA: 0.2 mg/dL (ref 0.0–1.0)

## 2014-01-10 LAB — CBC
HEMATOCRIT: 34.2 % — AB (ref 36.0–46.0)
HEMOGLOBIN: 11.1 g/dL — AB (ref 12.0–15.0)
MCH: 32.5 pg (ref 26.0–34.0)
MCHC: 32.5 g/dL (ref 30.0–36.0)
MCV: 100 fL (ref 78.0–100.0)
PLATELETS: 226 10*3/uL (ref 150–400)
RBC: 3.42 MIL/uL — AB (ref 3.87–5.11)
RDW: 12.6 % (ref 11.5–15.5)
WBC: 12.6 10*3/uL — AB (ref 4.0–10.5)

## 2014-01-10 LAB — I-STAT CG4 LACTIC ACID, ED: Lactic Acid, Venous: 0.78 mmol/L (ref 0.5–2.2)

## 2014-01-10 LAB — URINE MICROSCOPIC-ADD ON

## 2014-01-10 MED ORDER — SODIUM CHLORIDE 0.9 % IV BOLUS (SEPSIS)
1000.0000 mL | Freq: Once | INTRAVENOUS | Status: AC
  Administered 2014-01-10: 1000 mL via INTRAVENOUS

## 2014-01-10 NOTE — ED Notes (Signed)
Pt stated that she is having bodyaches and that she is "urinating everywhere." Pt stated that she "just doesn't feel good." Pt is taking cipro for recent UTI.

## 2014-01-10 NOTE — ED Notes (Signed)
Per EMS:  Pt states she feels pain all over.  Recently diagnosed with UTI, has taken 3 doses of Cipro.  Pt insisted on being seen for generalized non-specific pain.

## 2014-01-10 NOTE — ED Notes (Signed)
[facility]

## 2014-01-10 NOTE — ED Provider Notes (Addendum)
Medical screening examination/treatment/procedure(s) were conducted as a shared visit with non-physician practitioner(s) and myself.  I personally evaluated the patient during the encounter.   EKG Interpretation None       Results for orders placed during the hospital encounter of 05/17/13  ACETAMINOPHEN LEVEL      Result Value Ref Range   Acetaminophen (Tylenol), Serum <15.0  10 - 30 ug/mL  CBC      Result Value Ref Range   WBC 12.9 (*) 4.0 - 10.5 K/uL   RBC 3.88  3.87 - 5.11 MIL/uL   Hemoglobin 11.7 (*) 12.0 - 15.0 g/dL   HCT 09.835.9 (*) 11.936.0 - 14.746.0 %   MCV 92.5  78.0 - 100.0 fL   MCH 30.2  26.0 - 34.0 pg   MCHC 32.6  30.0 - 36.0 g/dL   RDW 82.913.4  56.211.5 - 13.015.5 %   Platelets 258  150 - 400 K/uL  COMPREHENSIVE METABOLIC PANEL      Result Value Ref Range   Sodium 144  137 - 147 mEq/L   Potassium 4.7  3.7 - 5.3 mEq/L   Chloride 106  96 - 112 mEq/L   CO2 24  19 - 32 mEq/L   Glucose, Bld 103 (*) 70 - 99 mg/dL   BUN 28 (*) 6 - 23 mg/dL   Creatinine, Ser 8.651.35 (*) 0.50 - 1.10 mg/dL   Calcium 78.410.0  8.4 - 69.610.5 mg/dL   Total Protein 7.2  6.0 - 8.3 g/dL   Albumin 3.8  3.5 - 5.2 g/dL   AST 25  0 - 37 U/L   ALT 23  0 - 35 U/L   Alkaline Phosphatase 74  39 - 117 U/L   Total Bilirubin 0.4  0.3 - 1.2 mg/dL   GFR calc non Af Amer 35 (*) >90 mL/min   GFR calc Af Amer 41 (*) >90 mL/min  ETHANOL      Result Value Ref Range   Alcohol, Ethyl (B) <11  0 - 11 mg/dL  SALICYLATE LEVEL      Result Value Ref Range   Salicylate Lvl <2.0 (*) 2.8 - 20.0 mg/dL  URINE RAPID DRUG SCREEN (HOSP PERFORMED)      Result Value Ref Range   Opiates NONE DETECTED  NONE DETECTED   Cocaine NONE DETECTED  NONE DETECTED   Benzodiazepines NONE DETECTED  NONE DETECTED   Amphetamines NONE DETECTED  NONE DETECTED   Tetrahydrocannabinol NONE DETECTED  NONE DETECTED   Barbiturates NONE DETECTED  NONE DETECTED  URINALYSIS, ROUTINE W REFLEX MICROSCOPIC      Result Value Ref Range   Color, Urine YELLOW  YELLOW   APPearance CLEAR  CLEAR   Specific Gravity, Urine 1.015  1.005 - 1.030   pH 7.0  5.0 - 8.0   Glucose, UA NEGATIVE  NEGATIVE mg/dL   Hgb urine dipstick NEGATIVE  NEGATIVE   Bilirubin Urine NEGATIVE  NEGATIVE   Ketones, ur NEGATIVE  NEGATIVE mg/dL   Protein, ur NEGATIVE  NEGATIVE mg/dL   Urobilinogen, UA 0.2  0.0 - 1.0 mg/dL   Nitrite NEGATIVE  NEGATIVE   Leukocytes, UA NEGATIVE  NEGATIVE   Current labs are still pending. Patient brought in by EMS. Patient is from nursing home. EMS stated that patient states she felt pain all over. She was recently diagnosed with urinary tract infection has taken 3 doses of her Cipro. Here patient stated she is having body aches and that she is urinating all over herself. Patient states  she doesn't feel very good. Patient is known to us and to nursing here. Many times a lot of the symptoms are pain related. Labs to evaluate the status of the urinary tract infection and renal function are pending.  Vital signs without any significant abnormalities other than some mild hypertension. She saturations 98-99%. No fever.  Past medical history significant for history of narcotic abuse. Patient here is the alert and very responsive but there may be some degree of confusion.  Patient's heart regular rate and rhythm no murmurs. Lungs are clear bilaterally. Abdomen with some mild generalized tenderness no guarding no distention.  Vanetta MuldersScott Machael Raine, MD 01/10/14 1501  Vanetta MuldersScott Xylah Early, MD 01/10/14 [phone]

## 2014-01-10 NOTE — ED Provider Notes (Signed)
CSN: [id number]     Arrival date & time 01/10/14  1401 History   First MD Initiated Contact with Patient 01/10/14 1406     Chief Complaint  Patient presents with  . Generalized Body Aches     (Consider location/radiation/quality/duration/timing/severity/associated sxs/prior Treatment) HPI Comments: This is an [age] year old female with a past medical history of generalized pain, anxiety, UTI, episodic mood disorder, acute kidney failure, chronic diarrhea, narcotic abuse, GERD, depression, hypertension and diverticulitis who presents to the emergency department via EMS from assisted living facility stating she is "urinating all over myself" frequently over the past few days. She is currently being treated for a urinary tract infection and has taken 3 doses of Cipro. She is also complaining of generalized body aches and states she "just doesn't feel good". Denies fever, chills, nausea, vomiting, abdominal pain, chest pain or shortness of breath. She wears a diaper. Denies dysuria.  The history is provided by the patient.    Past Medical History  Diagnosis Date  . Stomach irritation   . Diverticulitis   . Hypertension   . Depression   . Hypothyroid   . GERD (gastroesophageal reflux disease)   . Narcotic abuse     Vicodin - last use 07/2012  . Chronic diarrhea   . H/O small bowel obstruction   . Acute kidney failure   . UTI (lower urinary tract infection)   . Episodic mood disorder   . Anxiety   . Insomnia   . Debility   . Generalized pain    Past Surgical History  Procedure Laterality Date  . Colon resection     Family History  Problem Relation Age of Onset  . Hypertension Sister   . Hyperlipidemia Neg Hx   . Diabetes Neg Hx    History  Substance Use Topics  . Smoking status: Never Smoker   . Smokeless tobacco: Never Used  . Alcohol Use: No   OB History   Grav Para Term Preterm Abortions TAB SAB Ect Mult Living                 Review of Systems  Genitourinary:  Positive for urgency and frequency.  Musculoskeletal: Positive for arthralgias and myalgias.  All other systems reviewed and are negative.     Allergies  Ace inhibitors; Morphine and related; and Oxymorphone  Home Medications   Prior to Admission medications   Medication Sig Start Date End Date Taking? Authorizing Provider  acetaminophen (TYLENOL) 325 MG tablet Take 650 mg by mouth every 6 (six) hours as needed.   Yes Historical Provider, MD  ciprofloxacin (CIPRO) 500 MG tablet Take 500 mg by mouth 2 (two) times daily.   Yes Historical Provider, MD  citalopram (CELEXA) 20 MG tablet Take 20 mg by mouth daily.   Yes Historical Provider, MD  clonazePAM (KLONOPIN) 0.5 MG tablet Take 0.25 mg by mouth 2 (two) times daily as needed for anxiety.   Yes Historical Provider, MD  gabapentin (NEURONTIN) 100 MG capsule Take 100-300 mg by mouth 2 (two) times daily. Take 1 capsule in the morning and take 2-3 capsules at bedtime   Yes Historical Provider, MD  LEVOTHROID 50 MCG tablet Take 50 mcg by mouth daily before breakfast.  06/17/10  Yes Historical Provider, MD  loperamide (IMODIUM) 2 MG capsule Take 2 mg by mouth as needed for diarrhea or loose stools.   Yes Historical Provider, MD  Melatonin ([employer] SLEEP AID) 5 MG TABS Take 5 mg by mouth at bedtime as  needed (for sleep).   Yes Historical Provider, MD  metoprolol tartrate (LOPRESSOR) 25 MG tablet Take 25 mg by mouth 2 (two) times daily.   Yes Historical Provider, MD  pantoprazole (PROTONIX) 40 MG tablet Take 40 mg by mouth daily.   Yes Historical Provider, MD  risperiDONE (RISPERDAL) 0.25 MG tablet Take 0.25 mg by mouth at bedtime.   Yes Historical Provider, MD   BP 161/61  Pulse 63  Temp(Src) 97.9 F (36.6 C) (Rectal)  Resp 16  Ht 4\' 11"  (1.499 m)  Wt 135 lb (61.236 kg)  BMI 27.25 kg/m2  SpO2 99% Physical Exam  Nursing note and vitals reviewed. Constitutional: She is oriented to person, place, and time. She appears well-developed and  well-nourished. No distress.  HENT:  Head: Normocephalic and atraumatic.  Mouth/Throat: Oropharynx is clear and moist.  Eyes: Conjunctivae are normal.  Neck: Normal range of motion. Neck supple.  Cardiovascular: Normal rate, regular rhythm and normal heart sounds.   Pulmonary/Chest: Effort normal and breath sounds normal.  Abdominal: Soft. Bowel sounds are normal. She exhibits no distension. There is no rebound and no guarding.  Mild suprapubic tenderness. No peritoneal signs. No CVAT.  Musculoskeletal: Normal range of motion. She exhibits no edema.  Neurological: She is alert and oriented to person, place, and time.  Skin: Skin is warm and dry. She is not diaphoretic.  Psychiatric: She has a normal mood and affect. Her behavior is normal.    ED Course  Procedures (including critical care time) Labs Review Labs Reviewed  CBC - Abnormal; Notable for the following:    WBC 12.6 (*)    RBC 3.42 (*)    Hemoglobin 11.1 (*)    HCT 34.2 (*)    All other components within normal limits  COMPREHENSIVE METABOLIC PANEL - Abnormal; Notable for the following:    Glucose, Bld 104 (*)    BUN 49 (*)    Creatinine, Ser 2.80 (*)    GFR calc non Af Amer 14 (*)    GFR calc Af Amer 17 (*)    Anion gap 17 (*)    All other components within normal limits  URINALYSIS, ROUTINE W REFLEX MICROSCOPIC - Abnormal; Notable for the following:    Hgb urine dipstick TRACE (*)    Leukocytes, UA MODERATE (*)    All other components within normal limits  URINE CULTURE  URINE MICROSCOPIC-ADD ON  LACTIC ACID, PLASMA  I-STAT CG4 LACTIC ACID, ED    Imaging Review No results found.   EKG Interpretation None      MDM   Final diagnoses:  Body aches  Renal insufficiency   Patient presenting with urinating on herself and generalized body aches. She is nontoxic appearing in no apparent distress. Afebrile, vital signs stable. Abdomen is soft with very mild tenderness suprapubic. Urine is not significant for  urinary tract infection. She has only taken 3 doses of Cipro, discussed importance of completing his antibiotic. Patient understands. Physical exam otherwise unremarkable. Labs showed leukocytosis of 12.6, elevated BUN and creatinine of 49 and 2.80 respectively. Normal lactate. Patient received 1 L fluid bolus. Given the last BUN and creatinine in our system is from March 2015, unsure when the renal insufficiency began. She is stable for discharge back to assisted living facility, follow-up with PCP. Pt understands. Return precautions given.  Case discussed with attending Dr. Deretha EmoryZackowski who also evaluated patient and agrees with plan of care.    Kathrynn SpeedRobyn M Lastacia Solum, PA-C 01/10/14 1733

## 2014-01-10 NOTE — Discharge Instructions (Signed)
Musculoskeletal Pain Musculoskeletal pain is muscle and boney aches and pains. These pains can occur in any part of the body. Your caregiver may treat you without knowing the cause of the pain. They may treat you if blood or urine tests, X-rays, and other tests were normal.  CAUSES There is often not a definite cause or reason for these pains. These pains may be caused by a type of germ (virus). The discomfort may also come from overuse. Overuse includes working out too hard when your body is not fit. Boney aches also come from weather changes. Bone is sensitive to atmospheric pressure changes. HOME CARE INSTRUCTIONS   Ask when your test results will be ready. Make sure you get your test results.  Only take over-the-counter or prescription medicines for pain, discomfort, or fever as directed by your caregiver. If you were given medications for your condition, do not drive, operate machinery or power tools, or sign legal documents for 24 hours. Do not drink alcohol. Do not take sleeping pills or other medications that may interfere with treatment.  Continue all activities unless the activities cause more pain. When the pain lessens, slowly resume normal activities. Gradually increase the intensity and duration of the activities or exercise.  During periods of severe pain, bed rest may be helpful. Lay or sit in any position that is comfortable.  Putting ice on the injured area.  Put ice in a bag.  Place a towel between your skin and the bag.  Leave the ice on for 15 to 20 minutes, 3 to 4 times a day.  Follow up with your caregiver for continued problems and no reason can be found for the pain. If the pain becomes worse or does not go away, it may be necessary to repeat tests or do additional testing. Your caregiver may need to look further for a possible cause. SEEK IMMEDIATE MEDICAL CARE IF:  You have pain that is getting worse and is not relieved by medications.  You develop chest pain  that is associated with shortness or breath, sweating, feeling sick to your stomach (nauseous), or throw up (vomit).  Your pain becomes localized to the abdomen.  You develop any new symptoms that seem different or that concern you. MAKE SURE YOU:   Understand these instructions.  Will watch your condition.  Will get help right away if you are not doing well or get worse. Document Released: 03/03/2005 Document Revised: 05/26/2011 Document Reviewed: 11/05/2012 [facility] Patient Information 2015 [place], [place]. This information is not intended to replace advice given to you by your health care provider. Make sure you discuss any questions you have with your health care provider.  Kidney Failure Kidney failure happens when the kidneys cannot remove waste and excess fluid that naturally builds up in your blood after your body breaks down food. This leads to a dangerous buildup of waste products and fluid in the blood. HOME CARE  Follow your diet as told by your doctor.  Take all medicines as told by your doctor.  Keep all of your dialysis appointments. Call if you are unable to keep an appointment. GET HELP RIGHT AWAY IF:   You make a lot more or very little pee (urine).  Your face or ankles puff up (swell).  You develop shortness of breath.  You develop weakness, feel tired, or you do not feel hungry (appetite loss).  You feel poorly for no known reason. MAKE SURE YOU:   Understand these instructions.  Will watch your  condition.  Will get help right away if you are not doing well or get worse. Document Released: 05/28/2009 Document Revised: 05/26/2011 Document Reviewed: 07/04/2009 [facility] Patient Information 2015 [place], [place]. This information is not intended to replace advice given to you by your health care provider. Make sure you discuss any questions you have with your health care provider.

## 2014-01-11 LAB — URINE CULTURE

## 2014-03-13 IMAGING — CR DG PELVIS 1-2V
1 series · 1 of 1 positions shown · non-contrast
Comparison: 10/13/2012

CLINICAL DATA: Dysuria

EXAM:
PELVIS - 1-2 VIEW

[t pelvis a.p.]
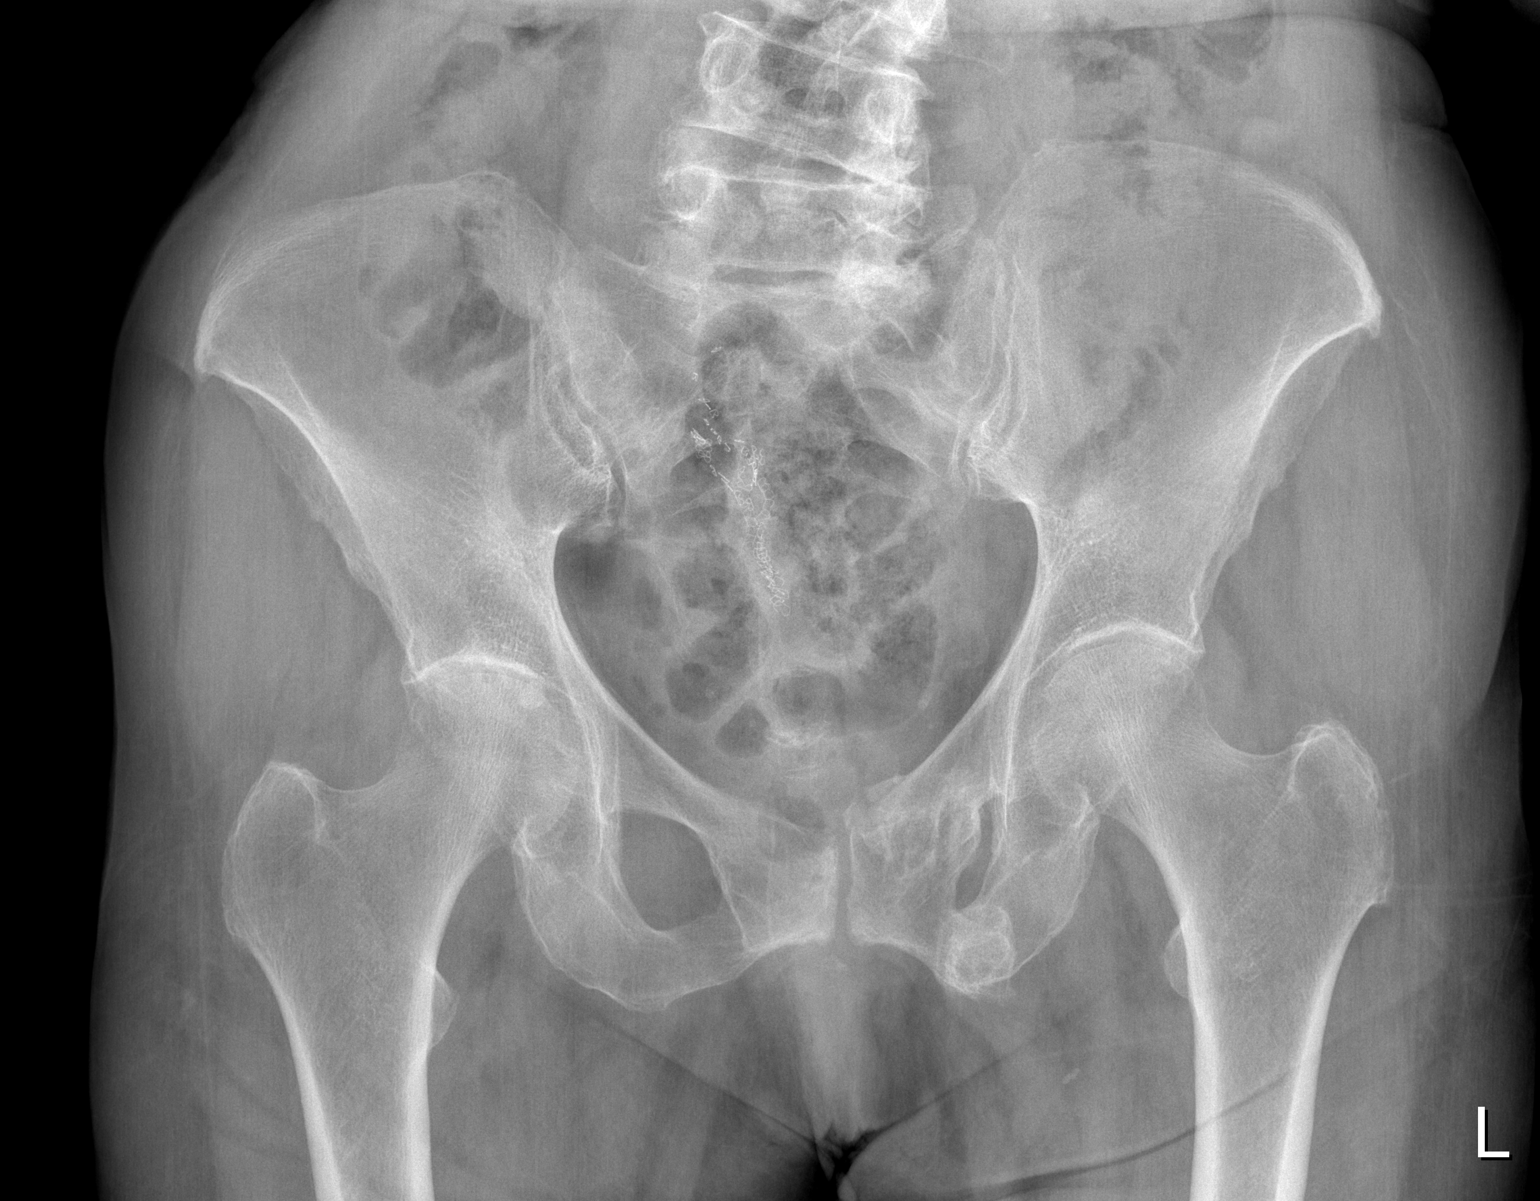

[1 of 1 positions shown; findings below may reference images not displayed]

FINDINGS: Remote left obturator ring fracture, unchanged in appearance. Hip
osteoarthritis with mild, moderate joint narrowing bilaterally.
Dextro curvature of the lower lumbar spine with preferential left
degenerative disc narrowing and facet osteoarthritis. Chain sutures
present in the pelvis, related to sigmoidectomy. No new or
suspicious calcifications in the pelvis. Lower abdominal bowel gas
pattern is nonobstructed.
IMPRESSION: No acute findings or change from prior. Chronic findings noted
above.

## 2014-03-24 ENCOUNTER — Emergency Department (HOSPITAL_BASED_OUTPATIENT_CLINIC_OR_DEPARTMENT_OTHER): Payer: Medicare Other

## 2014-03-24 ENCOUNTER — Emergency Department (HOSPITAL_BASED_OUTPATIENT_CLINIC_OR_DEPARTMENT_OTHER)
Admission: EM | Admit: 2014-03-24 | Discharge: 2014-03-29 | Disposition: A | Discharge status: Other Acute Inpatient Hospital | Payer: Medicare Other | Attending: Emergency Medicine | Admitting: Emergency Medicine

## 2014-03-24 ENCOUNTER — Encounter (HOSPITAL_BASED_OUTPATIENT_CLINIC_OR_DEPARTMENT_OTHER): Payer: Self-pay | Admitting: *Deleted

## 2014-03-24 DIAGNOSIS — E785 Hyperlipidemia, unspecified: Secondary | ICD-10-CM | POA: Insufficient documentation

## 2014-03-24 DIAGNOSIS — R4182 Altered mental status, unspecified: Secondary | ICD-10-CM | POA: Insufficient documentation

## 2014-03-24 DIAGNOSIS — F319 Bipolar disorder, unspecified: Secondary | ICD-10-CM | POA: Diagnosis present

## 2014-03-24 DIAGNOSIS — F3170 Bipolar disorder, currently in remission, most recent episode unspecified: Secondary | ICD-10-CM

## 2014-03-24 DIAGNOSIS — F0390 Unspecified dementia without behavioral disturbance: Secondary | ICD-10-CM | POA: Diagnosis present

## 2014-03-24 DIAGNOSIS — F068 Other specified mental disorders due to known physiological condition: Secondary | ICD-10-CM | POA: Diagnosis present

## 2014-03-24 DIAGNOSIS — I10 Essential (primary) hypertension: Secondary | ICD-10-CM | POA: Diagnosis not present

## 2014-03-24 DIAGNOSIS — E119 Type 2 diabetes mellitus without complications: Secondary | ICD-10-CM | POA: Insufficient documentation

## 2014-03-24 DIAGNOSIS — N390 Urinary tract infection, site not specified: Secondary | ICD-10-CM

## 2014-03-24 LAB — COMPREHENSIVE METABOLIC PANEL
ALBUMIN: 3.8 g/dL (ref 3.5–5.2)
ALT: 8 U/L (ref 0–35)
ANION GAP: 10 (ref 5–15)
AST: 19 U/L (ref 0–37)
Alkaline Phosphatase: 65 U/L (ref 39–117)
BILIRUBIN TOTAL: 0.9 mg/dL (ref 0.3–1.2)
BUN: 32 mg/dL — ABNORMAL HIGH (ref 6–23)
CHLORIDE: 108 meq/L (ref 96–112)
CO2: 22 mmol/L (ref 19–32)
Calcium: 9.6 mg/dL (ref 8.4–10.5)
Creatinine, Ser: 2.3 mg/dL — ABNORMAL HIGH (ref 0.50–1.10)
GFR calc non Af Amer: 18 mL/min — ABNORMAL LOW (ref >90)
GFR, EST AFRICAN AMERICAN: 21 mL/min — AB (ref >90)
Glucose, Bld: 87 mg/dL (ref 70–99)
Potassium: 4.5 mmol/L (ref 3.5–5.1)
Sodium: 140 mmol/L (ref 135–145)
Total Protein: 6.5 g/dL (ref 6.0–8.3)

## 2014-03-24 LAB — CBC
HCT: 36.9 % (ref 36.0–46.0)
Hemoglobin: 11.8 g/dL — ABNORMAL LOW (ref 12.0–15.0)
MCH: 31.5 pg (ref 26.0–34.0)
MCHC: 32 g/dL (ref 30.0–36.0)
MCV: 98.4 fL (ref 78.0–100.0)
Platelets: 244 10*3/uL (ref 150–400)
RBC: 3.75 MIL/uL — ABNORMAL LOW (ref 3.87–5.11)
RDW: 12.2 % (ref 11.5–15.5)
WBC: 11.7 10*3/uL — ABNORMAL HIGH (ref 4.0–10.5)

## 2014-03-24 LAB — URINE MICROSCOPIC-ADD ON

## 2014-03-24 LAB — URINALYSIS, ROUTINE W REFLEX MICROSCOPIC
Bilirubin Urine: NEGATIVE
Glucose, UA: NEGATIVE mg/dL
Hgb urine dipstick: NEGATIVE
Ketones, ur: NEGATIVE mg/dL
Leukocytes, UA: NEGATIVE
NITRITE: POSITIVE — AB
PH: 5.5 (ref 5.0–8.0)
Protein, ur: NEGATIVE mg/dL
SPECIFIC GRAVITY, URINE: 1.015 (ref 1.005–1.030)
UROBILINOGEN UA: 0.2 mg/dL (ref 0.0–1.0)

## 2014-03-24 LAB — BRAIN NATRIURETIC PEPTIDE: B Natriuretic Peptide: 381.9 pg/mL — ABNORMAL HIGH (ref 0.0–100.0)

## 2014-03-24 LAB — TROPONIN I: Troponin I: 0.04 ng/mL — ABNORMAL HIGH (ref <0.031)

## 2014-03-24 MED ORDER — ASPIRIN 81 MG PO CHEW
324.0000 mg | CHEWABLE_TABLET | Freq: Once | ORAL | Status: AC
  Administered 2014-03-24: 324 mg via ORAL
  Filled 2014-03-24: qty 4

## 2014-03-24 MED ORDER — CEFTRIAXONE SODIUM 1 G IJ SOLR
INTRAMUSCULAR | Status: AC
  Filled 2014-03-24: qty 10

## 2014-03-24 MED ORDER — LORAZEPAM 2 MG/ML IJ SOLN
INTRAMUSCULAR | Status: AC
  Administered 2014-03-24: 1 mg via INTRAVENOUS
  Filled 2014-03-24: qty 1

## 2014-03-24 MED ORDER — HALOPERIDOL LACTATE 5 MG/ML IJ SOLN
2.0000 mg | Freq: Once | INTRAMUSCULAR | Status: AC
  Administered 2014-03-24: 2 mg via INTRAVENOUS
  Filled 2014-03-24: qty 1

## 2014-03-24 MED ORDER — DEXTROSE 5 % IV SOLN
1.0000 g | Freq: Once | INTRAVENOUS | Status: AC
  Administered 2014-03-24: 1 g via INTRAVENOUS

## 2014-03-24 MED ORDER — SODIUM CHLORIDE 0.9 % IV BOLUS (SEPSIS)
500.0000 mL | Freq: Once | INTRAVENOUS | Status: AC
  Administered 2014-03-24: 500 mL via INTRAVENOUS

## 2014-03-24 MED ORDER — LORAZEPAM 2 MG/ML IJ SOLN
1.0000 mg | Freq: Once | INTRAMUSCULAR | Status: AC
Start: 2014-03-24 — End: 2014-03-24
  Administered 2014-03-24: 1 mg via INTRAVENOUS

## 2014-03-24 NOTE — ED Notes (Signed)
Pt more awake briefly. Denies pain. Sts she is cold. Does not recognize where she is. Pt in NAD.

## 2014-03-24 NOTE — ED Notes (Signed)
Pt now awake and talking °

## 2014-03-24 NOTE — ED Provider Notes (Signed)
CSN: [id number]     Arrival date & time 03/24/14  [id number] History   First MD Initiated Contact with Patient 03/24/14 0913     Chief Complaint  Patient presents with  . Altered Mental Status     (Consider location/radiation/quality/duration/timing/severity/associated sxs/prior Treatment) Patient is a [age] y.o. female presenting with altered mental status. The history is provided by the nursing home and the EMS personnel.  Altered Mental Status Presenting symptoms: lethargy   Severity:  Moderate Most recent episode:  2 days ago Episode history:  Single Timing:  Constant Progression:  Waxing and waning Chronicity:  New Context: recent change in medication (recently had seroquel added to her meds)   Associated symptoms: normal movement and no vomiting    Level V caveat - altered mental status.  Past Medical History  Diagnosis Date  . Stomach irritation   . Diverticulitis   . Hypertension   . Depression   . Hypothyroid   . GERD (gastroesophageal reflux disease)   . Narcotic abuse     Vicodin - last use 07/2012  . Chronic diarrhea   . H/O small bowel obstruction   . Acute kidney failure   . UTI (lower urinary tract infection)   . Episodic mood disorder   . Anxiety   . Insomnia   . Debility   . Generalized pain    Past Surgical History  Procedure Laterality Date  . Colon resection     Family History  Problem Relation Age of Onset  . Hypertension Sister   . Hyperlipidemia Neg Hx   . Diabetes Neg Hx    History  Substance Use Topics  . Smoking status: Never Smoker   . Smokeless tobacco: Never Used  . Alcohol Use: No   OB History    No data available     Review of Systems  Unable to perform ROS: Mental status change  Gastrointestinal: Negative for vomiting.      Allergies  Ace inhibitors; Morphine and related; and Oxymorphone  Home Medications   Prior to Admission medications   Medication Sig Start Date End Date Taking? Authorizing Provider  rivastigmine  (EXELON) 4.6 mg/24hr Place 4.6 mg onto the skin daily.   Yes Historical Provider, MD  acetaminophen (TYLENOL) 325 MG tablet Take 650 mg by mouth every 6 (six) hours as needed.    Historical Provider, MD  ciprofloxacin (CIPRO) 500 MG tablet Take 500 mg by mouth 2 (two) times daily.    Historical Provider, MD  citalopram (CELEXA) 20 MG tablet Take 20 mg by mouth daily.    Historical Provider, MD  clonazePAM (KLONOPIN) 0.5 MG tablet Take 0.25 mg by mouth 2 (two) times daily as needed for anxiety.    Historical Provider, MD  gabapentin (NEURONTIN) 100 MG capsule Take 100-300 mg by mouth 2 (two) times daily. Take 1 capsule in the morning and take 2-3 capsules at bedtime    Historical Provider, MD  LEVOTHROID 50 MCG tablet Take 50 mcg by mouth daily before breakfast.  06/17/10   Historical Provider, MD  loperamide (IMODIUM) 2 MG capsule Take 2 mg by mouth as needed for diarrhea or loose stools.    Historical Provider, MD  Melatonin ([employer] SLEEP AID) 5 MG TABS Take 5 mg by mouth at bedtime as needed (for sleep).    Historical Provider, MD  metoprolol tartrate (LOPRESSOR) 25 MG tablet Take 25 mg by mouth 2 (two) times daily.    Historical Provider, MD  pantoprazole (PROTONIX) 40 MG tablet Take  40 mg by mouth daily.    Historical Provider, MD  risperiDONE (RISPERDAL) 0.25 MG tablet Take 0.25 mg by mouth at bedtime.    Historical Provider, MD   BP 144/69 mmHg  Pulse 77  Temp(Src) 98.4 F (36.9 C) (Oral)  Resp 16  Wt 135 lb (61.236 kg)  SpO2 98% Physical Exam  Constitutional: She appears well-developed and well-nourished. She appears lethargic. No distress.  HENT:  Head: Normocephalic and atraumatic.  Mouth/Throat: Oropharynx is clear and moist.  Eyes: EOM are normal.  L pupil round, reactive to light R pupil irregular, reverse teardrop with point at 6 o'clock, nonreactive  Neck: Normal range of motion. Neck supple.  Cardiovascular: Normal rate and regular rhythm.  Exam reveals no friction rub.   No  murmur heard. Pulmonary/Chest: Effort normal and breath sounds normal. No respiratory distress. She has no wheezes. She has no rales.  Abdominal: Soft. She exhibits no distension. There is no tenderness. There is no rebound.  Musculoskeletal: Normal range of motion. She exhibits no edema.  Neurological: She appears lethargic. GCS eye subscore is 4. GCS verbal subscore is 1. GCS motor subscore is 5.  Reflex Scores:      Patellar reflexes are 2+ on the right side and 2+ on the left side. Cannot cooperate in neuro exam. 5-6 beats of clonus in L foot 2 beats in R foot.  Skin: She is not diaphoretic.  Nursing note and vitals reviewed.   ED Course  Procedures (including critical care time) Labs Review Labs Reviewed - No data to display  Imaging Review No results found.   EKG Interpretation   Date/Time:  Friday March 24 2014 11:00:19 EST Ventricular Rate:  84 PR Interval:  148 QRS Duration: 74 QT Interval:  396 QTC Calculation: 467 R Axis:   2 Text Interpretation:  Normal sinus rhythm with sinus arrhythmia Inferior  infarct , age undetermined Anterior infarct , age undetermined Abnormal  ECG Similar to prior Confirmed by Gwendolyn Grant  MD, Horst Ostermiller ([id number]) on 03/24/2014  11:06:12 AM      MDM   Final diagnoses:  Altered mental status  UTI (lower urinary tract infection)    52F here with altered mental status. Lethargic at her SNF. Was recently started on Seroquel, has been like this since initiation of this drug. Also on clonazepam, gabapentin, celexa, risperidone.  Here with stable vitals. In/out of a talkative state. Well known to our ED, our nurses state she usually pleasantly demented; today's presentation is a change from her normal.  Here will occasionally track me with eye movements, but doesn't speak. L ankle clonus, about 5-6 beats, R ankle with 2 beats. Normal reflexes. No tenderness upon palpation of bony skeleton. Will check Head CT, labs, CXR, urine studies, cardiac  w/u. Urine labs show positive nitrites and she has mildly positive troponin.  I spoke with Dr. Renee Rival at [facility], who accepted patient in transfer. Patient awaiting bed there, would be ok with transfer to [facility] if bed not available at [facility] in reasonable amount of time.   Elwin Mocha, MD 03/27/14 [phone]

## 2014-03-24 NOTE — ED Notes (Signed)
Pt from SNF by EMS for lethargy. EMS reports SNF staff told them pt recently started on Seroquel without adjusting other meds and that pt has been sleeping since beginning the seroquel 2 days ago. EMS reports VS WNL and pt responds somewhat to voice. CBG=101

## 2014-03-24 NOTE — ED Notes (Signed)
MD at bedside. 

## 2014-03-24 NOTE — ED Notes (Signed)
Please call pt's son [name] at [phone] when pt is transferred.

## 2014-03-25 DIAGNOSIS — F0390 Unspecified dementia without behavioral disturbance: Secondary | ICD-10-CM

## 2014-03-25 DIAGNOSIS — F29 Unspecified psychosis not due to a substance or known physiological condition: Secondary | ICD-10-CM | POA: Insufficient documentation

## 2014-03-25 DIAGNOSIS — R4182 Altered mental status, unspecified: Secondary | ICD-10-CM | POA: Diagnosis not present

## 2014-03-25 MED ORDER — GABAPENTIN 100 MG PO CAPS: 100.0000 mg | ORAL_CAPSULE | Freq: Two times a day (BID) | ORAL | Status: DC

## 2014-03-25 MED ORDER — CITALOPRAM HYDROBROMIDE 20 MG PO TABS
20.0000 mg | ORAL_TABLET | Freq: Every day | ORAL | Status: DC
  Administered 2014-03-26 – 2014-03-29 (×4): 20 mg via ORAL
  Filled 2014-03-25 (×7): qty 1

## 2014-03-25 MED ORDER — METOPROLOL TARTRATE 25 MG PO TABS
25.0000 mg | ORAL_TABLET | Freq: Two times a day (BID) | ORAL | Status: DC
  Administered 2014-03-25 – 2014-03-29 (×9): 25 mg via ORAL
  Filled 2014-03-25 (×9): qty 1

## 2014-03-25 MED ORDER — RIVASTIGMINE 4.6 MG/24HR TD PT24
4.6000 mg | MEDICATED_PATCH | Freq: Every day | TRANSDERMAL | Status: DC
  Administered 2014-03-26 – 2014-03-29 (×4): 4.6 mg via TRANSDERMAL
  Filled 2014-03-25 (×6): qty 1

## 2014-03-25 MED ORDER — CLONAZEPAM 0.5 MG PO TABS
0.2500 mg | ORAL_TABLET | Freq: Two times a day (BID) | ORAL | Status: DC | PRN
  Administered 2014-03-25 – 2014-03-28 (×5): 0.25 mg via ORAL
  Filled 2014-03-25 (×6): qty 1

## 2014-03-25 MED ORDER — GABAPENTIN 100 MG PO CAPS
100.0000 mg | ORAL_CAPSULE | Freq: Every day | ORAL | Status: DC
  Administered 2014-03-25 – 2014-03-29 (×5): 100 mg via ORAL
  Filled 2014-03-25 (×5): qty 1

## 2014-03-25 MED ORDER — PANTOPRAZOLE SODIUM 40 MG PO TBEC
40.0000 mg | DELAYED_RELEASE_TABLET | Freq: Every day | ORAL | Status: DC
  Administered 2014-03-25 – 2014-03-29 (×5): 40 mg via ORAL
  Filled 2014-03-25 (×5): qty 1

## 2014-03-25 MED ORDER — LEVOTHYROXINE SODIUM 50 MCG PO TABS
50.0000 ug | ORAL_TABLET | Freq: Every day | ORAL | Status: DC
  Administered 2014-03-26 – 2014-03-29 (×4): 50 ug via ORAL
  Filled 2014-03-25 (×5): qty 1

## 2014-03-25 MED ORDER — CEPHALEXIN 500 MG PO CAPS
500.0000 mg | ORAL_CAPSULE | Freq: Two times a day (BID) | ORAL | Status: DC
  Administered 2014-03-25 – 2014-03-29 (×9): 500 mg via ORAL
  Filled 2014-03-25 (×9): qty 1

## 2014-03-25 MED ORDER — ACETAMINOPHEN 325 MG PO TABS
650.0000 mg | ORAL_TABLET | ORAL | Status: DC | PRN
  Administered 2014-03-27 – 2014-03-28 (×2): 650 mg via ORAL
  Filled 2014-03-25 (×2): qty 2

## 2014-03-25 NOTE — BH Assessment (Signed)
[facility] Assessment Progress Note  Spoke with charge nurse Asher MuirJamie, who said that pt has been manic all night and has just fallen asleep. They will bring machine in the pt's room to attempt assessment.

## 2014-03-25 NOTE — ED Notes (Signed)
Family at bedside. Brought breakfast for patient.

## 2014-03-25 NOTE — ED Notes (Signed)
Cleaned pt after BM and put new gown on and redressed bed.

## 2014-03-25 NOTE — ED Notes (Signed)
Called that son to update him as to the patients status and the possible pending transfer back to her assisted living. The son gave further information regarding the patients mental status at home in the assisted living. The p[atient has not been eating and drinking for possibly a week, and the patient is up wandering the hallways yelling out and disterbing the other patients. The patient has also been calling 911 and yelling"help me help me" at the time. The son states at time she is so agitated that she is a danger to herself because she fighting against him and his sister to the point of restraining her. The patient is not supposed to be up and walking around with a walker and patient has been getting out of bed on own and walking around. Son would like a PSY evaluation prior to the patient leaving here.  MD [facility] given this information and orders received. Orders received.

## 2014-03-25 NOTE — BH Assessment (Addendum)
Tele Assessment Note   Janice GelinasJuanita R Miller is an 81 y.o. female, who, per Dr. Fredderick PhenixBelfi, "Pt came in from assisted living facility for altered MS.  Son here who states that pt has had recurrent UTIs and this he is concerned that her current behavior is more psychiatric in nature. She has a hx of Bipolar d/o as well as dementia and over the last 10 days, has been manic, pacing the halls at night, disturbing the other residents, calling 911 numerous times and sleeping during the day. She has been very agitated and aggressive".  Writer spoke with pt and pt's son, who is her power of atty. Pt stated she wanted him to be in the room during assessment. Pt was oriented to self, month, year, but not location or situation.  Pt was drowsy, but awake during assessment. She endorsed depression, but no SI, HI. She endorsed some psychosis recently, but not at this moment, and there was no evidence of her responding to internal stimuli. Son reports that pt was actively hallucinating yesterday, combative towards him and pt's dgtr when they tried to keep her safely in bed. Pt normally uses a walker, and sometimes a wheelchair, but said that yesterday, she was walking independently, but he was concerned about the safety of this.  Pt admits to a history if 1 suicide attempt 9 months ago by overdose, and a history of bipolar.  Per pt's son, stressors include not being able to do the things she used to enjoy as she ages.  Pt just said she "hasn't felt right in a while".  Pt denies any history of abuse. She admits to years of narcotic (Vicodin) use, but has not used since 2014. Pt's son says this is how she kept her bipolar under control.  Pt was cooperative and polite during assessment.  She had good eye contact, casual appearance, slow, minimal speech, flat affect, normal thought process.   Renata Capriceonrad, NP recommends geropsych placement for pt. TTS will attempt placement.  Axis I: Psychotic Disorder NOS Axis II: Deferred Axis III:   Past Medical History  Diagnosis Date  . Stomach irritation   . Diverticulitis   . Hypertension   . Depression   . Hypothyroid   . GERD (gastroesophageal reflux disease)   . Narcotic abuse     Vicodin - last use 07/2012  . Chronic diarrhea   . H/O small bowel obstruction   . Acute kidney failure   . UTI (lower urinary tract infection)   . Episodic mood disorder   . Anxiety   . Insomnia   . Debility   . Generalized pain    Axis IV: none known Axis V: 21-30 behavior considerably influenced by delusions or hallucinations OR serious impairment in judgment, communication OR inability to function in almost all areas  Past Medical History:  Past Medical History  Diagnosis Date  . Stomach irritation   . Diverticulitis   . Hypertension   . Depression   . Hypothyroid   . GERD (gastroesophageal reflux disease)   . Narcotic abuse     Vicodin - last use 07/2012  . Chronic diarrhea   . H/O small bowel obstruction   . Acute kidney failure   . UTI (lower urinary tract infection)   . Episodic mood disorder   . Anxiety   . Insomnia   . Debility   . Generalized pain     Past Surgical History  Procedure Laterality Date  . Colon resection  Family History:  Family History  Problem Relation Age of Onset  . Hypertension Sister   . Hyperlipidemia Neg Hx   . Diabetes Neg Hx     Social History:  reports that she has never smoked. She has never used smokeless tobacco. She reports that she does not drink alcohol or use illicit drugs.  Additional Social History:  Alcohol / Drug Use Pain Medications: denies Prescriptions: denies Over the Counter: denies History of alcohol / drug use?:  ("a long time ago" vicodin)  CIWA: CIWA-Ar BP: 178/73 mmHg Pulse Rate: 84 COWS:    PATIENT STRENGTHS: (choose at least two) Communication skills Supportive family/friends  Allergies:  Allergies  Allergen Reactions  . Ace Inhibitors Cough  . Morphine And Related   . Oxymorphone      Home Medications:  (Not in a hospital admission)  OB/GYN Status:  No LMP recorded. Patient is postmenopausal.  General Assessment Data Location of Assessment:  ([facility]) Is this a Tele or Face-to-Face Assessment?: Tele Assessment Is this an Initial Assessment or a Re-assessment for this encounter?: Initial Assessment Living Arrangements:  (assisted living) Can pt return to current living arrangement?: Yes Admission Status: Voluntary Is patient capable of signing voluntary admission?: Yes Transfer from: Nsg Home Referral Source: Self/Family/Friend     [facility] Crisis Care Plan Living Arrangements:  (assisted living) Name of Psychiatrist:  Gennie Alma) Name of Therapist:  (therapist in same group)  Education Status Is patient currently in school?: No  Risk to self with the past 6 months Suicidal Ideation: No Suicidal Intent: No Is patient at risk for suicide?: No Suicidal Plan?: No Access to Means: No What has been your use of drugs/alcohol within the last 12 months?:  (none) Previous Attempts/Gestures: Yes How many times?:  (1x) Other Self Harm Risks:  (walking ) Triggers for Past Attempts: Unknown Intentional Self Injurious Behavior: None Family Suicide History: Unknown Recent stressful life event(s):  (none known) Persecutory voices/beliefs?: No Depression: Yes Depression Symptoms: Insomnia, Feeling angry/irritable, Isolating Substance abuse history and/or treatment for substance abuse?: Yes Suicide prevention information given to non-admitted patients: Not applicable  Risk to Others within the past 6 months Homicidal Ideation: No Thoughts of Harm to Others: No Current Homicidal Intent: No Current Homicidal Plan: No Access to Homicidal Means: No History of harm to others?: No Assessment of Violence:  (struggling against son, daughter-aggitation) Does patient have access to weapons?: No Criminal Charges Pending?: No Does patient have a court date:  No  Psychosis Hallucinations: Auditory, Visual  Mental Status Report Appear/Hygiene: In scrubs, Unremarkable Eye Contact: Fair Motor Activity: Unremarkable Speech: Logical/coherent, Slow Level of Consciousness: Drowsy Mood: Depressed, Sad Affect: Sad, Flat Anxiety Level: Moderate Thought Processes: Coherent, Relevant Judgement: Impaired Orientation: Situation, Person Obsessive Compulsive Thoughts/Behaviors: Minimal  Cognitive Functioning Concentration: Decreased Memory: Recent Impaired, Remote Impaired IQ: Average Insight: Poor Impulse Control: Poor Appetite: Poor Weight Loss: 5 Weight Gain: 0 Sleep: Decreased Total Hours of Sleep: 8 Vegetative Symptoms: Decreased grooming  ADLScreening [facility] Assessment Services) Patient's cognitive ability adequate to safely complete daily activities?: No Patient able to express need for assistance with ADLs?: No Independently performs ADLs?: No  Prior Inpatient Therapy Prior Inpatient Therapy: Yes Prior Therapy Dates:  (Feb) Prior Therapy Facilty/Provider(s):  [facility])  Prior Outpatient Therapy Prior Outpatient Therapy: Yes Prior Therapy Dates:  (last year) Prior Therapy Facilty/Provider(s):  (see above)  ADL Screening (condition at time of admission) Patient's cognitive ability adequate to safely complete daily activities?: No Is the patient deaf or  have difficulty hearing?: No Does the patient have difficulty seeing, even when wearing glasses/contacts?: No Does the patient have difficulty concentrating, remembering, or making decisions?: Yes Patient able to express need for assistance with ADLs?: No Does the patient have difficulty dressing or bathing?: Yes Independently performs ADLs?: No Communication: Independent Dressing (OT): Needs assistance Is this a change from baseline?: Pre-admission baseline Grooming: Needs assistance Feeding: Needs assistance Is this a change from baseline?: Change from baseline,  expected to last >3 days Bathing: Needs assistance Is this a change from baseline?: Change from baseline, expected to last >3 days Toileting: Needs assistance Is this a change from baseline?: Change from baseline, expected to last >3days In/Out Bed: Needs assistance Is this a change from baseline?: Pre-admission baseline Walks in Home: Needs assistance Is this a change from baseline?: Pre-admission baseline Does the patient have difficulty walking or climbing stairs?: Yes  Home Assistive Devices/Equipment Home Assistive Devices/Equipment: Wheelchair, Environmental consultant (specify type)    Abuse/Neglect Assessment (Assessment to be complete while patient is alone) Physical Abuse: Denies Verbal Abuse: Denies Sexual Abuse: Denies Exploitation of patient/patient's resources: Denies Self-Neglect: Denies     Merchant navy officer (For Healthcare) Does patient have an advance directive?: No Would patient like information on creating an advanced directive?: No - patient declined information    Additional Information 1:1 In Past 12 Months?: No CIRT Risk: Yes Elopement Risk: Yes Does patient have medical clearance?: Yes     Disposition:  Disposition Initial Assessment Completed for this Encounter: Yes Disposition of Patient: Other dispositions (psych consult) Other disposition(s):  (psych consult)  [facility] 03/25/2014 8:12 AM

## 2014-03-25 NOTE — ED Notes (Signed)
TTS in progress at bedside, son at bedside with patient. RN present as well

## 2014-03-25 NOTE — ED Notes (Signed)
Bed: WA27 Expected date:  Expected time:  Means of arrival:  Comments[facility]

## 2014-03-25 NOTE — Consult Note (Signed)
[place] Psychiatry Consult   Reason for Consult:  Altered mental status, confusion Referring Physician:  EDP [name] is an 81 y.o. female. Total Time spent with patient: 45 minutes  Assessment: AXIS I:  Psychotic Disorder NOS and Dementia, unspecified AXIS II:  Deferred AXIS III:   Past Medical History  Diagnosis Date  . Stomach irritation   . Diverticulitis   . Hypertension   . Depression   . Hypothyroid   . GERD (gastroesophageal reflux disease)   . Narcotic abuse     Vicodin - last use 07/2012  . Chronic diarrhea   . H/O small bowel obstruction   . Acute kidney failure   . UTI (lower urinary tract infection)   . Episodic mood disorder   . Anxiety   . Insomnia   . Debility   . Generalized pain    AXIS IV:  other psychosocial or environmental problems and problems related to social environment AXIS V:  21-30 behavior considerably influenced by delusions or hallucinations OR serious impairment in judgment, communication OR inability to function in almost all areas  Plan:  Recommend psychiatric Inpatient admission when medically cleared. Gero-psychiatric facility  Subjective:   [name] is a 81 y.o. female patient admitted with Altered mental status.  HPI:  81 years old caucasian female was admitted from [facility] center for evaluation and placement at a Gero-psychiatric facility.   Patient stated her full name, the year, her son and daughter's name.  Patient was not able to state where she is at the moment.  Patient live at an assisted living facility and was doing well until 10 day ago when she started getting more confused and agitated.  She was not getting enough sleep at night and was walking all night.  Collateral information from her son Delfino Lovett is that his mother's Risperdal was changed from 0.22m to 0.553mdaily because of her behavior changes as she was screaming out and occasionally yelling instead of sleeping.  She became lethargic  needing her son taking her to see a Neurologist.  The Neurologist discontinued her Risperdal entirely and placed patient on Seroquel.  Patient, at this time became more confused, agitated , started yelling and repeating self.  She could not sleep at all at this point.  [facility] where patient was seen via Tele-psychiatry.  [facility] Patient was calm during my encounter with her but stated that she was going out to shop with her son later today.  She denied SI/HI/AVH.  We will hold all Psychotropic medications until patient clears out while we look for a Gero-psychiatric bed for admission.. Marland Kitchen  HPI Elements:   Location:  Psychosis, Altered mental status, Dementia unspecified by hx. Quality:  confused, agitation, insomnia. Severity:  severe. Timing:  acute. Duration:  Chronic. Context:  Brought in by son for evaluation of Mental status change..  Past Psychiatric History: Past Medical History  Diagnosis Date  . Stomach irritation   . Diverticulitis   . Hypertension   . Depression   . Hypothyroid   . GERD (gastroesophageal reflux disease)   . Narcotic abuse     Vicodin - last use 07/2012  . Chronic diarrhea   . H/O small bowel obstruction   . Acute kidney failure   . UTI (lower urinary tract infection)   . Episodic mood disorder   . Anxiety   .  Insomnia   . Debility   . Generalized pain     reports that she has never smoked. She has never used smokeless tobacco. She reports that she does not drink alcohol or use illicit drugs. Family History  Problem Relation Age of Onset  . Hypertension Sister   . Hyperlipidemia Neg Hx   . Diabetes Neg Hx    Family History Substance Abuse: No Family Supports: Yes, List: (son, dgtr) Living Arrangements:  (assisted living) Can pt return to current living arrangement?: Yes Abuse/Neglect [facility]) Physical Abuse: Denies Verbal Abuse: Denies Sexual Abuse: Denies Allergies:    Allergies  Allergen Reactions  . Ace Inhibitors Cough  . Morphine And Related     unknown  . Oxymorphone     unknown  . Quetiapine     Dementia like symptoms    ACT Assessment Complete:  Yes:    Educational Status    Risk to Self: Risk to self with the past 6 months Suicidal Ideation: No Suicidal Intent: No Is patient at risk for suicide?: No Suicidal Plan?: No Access to Means: No What has been your use of drugs/alcohol within the last 12 months?:  (none) Previous Attempts/Gestures: Yes How many times?:  (1x) Other Self Harm Risks:  (walking ) Triggers for Past Attempts: Unknown Intentional Self Injurious Behavior: None Family Suicide History: Unknown Recent stressful life event(s):  (none known) Persecutory voices/beliefs?: No Depression: Yes Depression Symptoms: Insomnia, Feeling angry/irritable, Isolating Substance abuse history and/or treatment for substance abuse?: Yes Suicide prevention information given to non-admitted patients: Not applicable  Risk to Others: Risk to Others within the past 6 months Homicidal Ideation: No Thoughts of Harm to Others: No Current Homicidal Intent: No Current Homicidal Plan: No Access to Homicidal Means: No History of harm to others?: No Assessment of Violence:  (struggling against son, daughter-aggitation) Does patient have access to weapons?: No Criminal Charges Pending?: No Does patient have a court date: No  Abuse: Abuse/Neglect Assessment (Assessment to be complete while patient is alone) Physical Abuse: Denies Verbal Abuse: Denies Sexual Abuse: Denies Exploitation of patient/patient's resources: Denies Self-Neglect: Denies  Prior Inpatient Therapy: Prior Inpatient Therapy Prior Inpatient Therapy: Yes Prior Therapy Dates:  (Feb) Prior Therapy Facilty/Provider(s):  [facility]  Prior Outpatient Therapy: Prior Outpatient Therapy Prior Outpatient Therapy: Yes Prior Therapy Dates:  (last year) Prior Therapy  Facilty/Provider(s):  (see above)  Additional Information: Additional Information 1:1 In Past 12 Months?: No CIRT Risk: Yes Elopement Risk: Yes Does patient have medical clearance?: Yes   Objective: Blood pressure 158/98, pulse 97, temperature 97.5 F (36.4 C), temperature source Oral, resp. rate 16, weight 61.236 kg (135 lb), SpO2 95 %.Body mass index is 27.25 kg/(m^2). Results for orders placed or performed during the hospital encounter of 03/24/14 (from the past 72 hour(s))  CBC     Status: Abnormal   Collection Time: 03/24/14 10:20 AM  Result Value Ref Range   WBC 11.7 (H) 4.0 - 10.5 K/uL   RBC 3.75 (L) 3.87 - 5.11 MIL/uL   Hemoglobin 11.8 (L) 12.0 - 15.0 g/dL   HCT 36.9 36.0 - 46.0 %   MCV 98.4 78.0 - 100.0 fL   MCH 31.5 26.0 - 34.0 pg   MCHC 32.0 30.0 - 36.0 g/dL   RDW 12.2 11.5 - 15.5 %   Platelets 244 150 - 400 K/uL  Comprehensive metabolic panel     Status: Abnormal   Collection Time: 03/24/14 10:20 AM  Result Value Ref Range   Sodium 140 135 -  145 mmol/L    Comment: Please note change in reference range.   Potassium 4.5 3.5 - 5.1 mmol/L    Comment: Please note change in reference range.   Chloride 108 96 - 112 mEq/L   CO2 22 19 - 32 mmol/L   Glucose, Bld 87 70 - 99 mg/dL   BUN 32 (H) 6 - 23 mg/dL   Creatinine, Ser 2.30 (H) 0.50 - 1.10 mg/dL   Calcium 9.6 8.4 - 10.5 mg/dL   Total Protein 6.5 6.0 - 8.3 g/dL   Albumin 3.8 3.5 - 5.2 g/dL   AST 19 0 - 37 U/L   ALT 8 0 - 35 U/L   Alkaline Phosphatase 65 39 - 117 U/L   Total Bilirubin 0.9 0.3 - 1.2 mg/dL   GFR calc non Af Amer 18 (L) >90 mL/min   GFR calc Af Amer 21 (L) >90 mL/min    Comment: (NOTE) The eGFR has been calculated using the CKD EPI equation. This calculation has not been validated in all clinical situations. eGFR's persistently <90 mL/min signify possible Chronic Kidney Disease.    Anion gap 10 5 - 15  Troponin I     Status: Abnormal   Collection Time: 03/24/14 10:20 AM  Result Value Ref Range    Troponin I 0.04 (H) <0.031 ng/mL    Comment:        PERSISTENTLY INCREASED TROPONIN VALUES IN THE RANGE OF 0.04-0.49 ng/mL CAN BE SEEN IN:       -UNSTABLE ANGINA       -CONGESTIVE HEART FAILURE       -MYOCARDITIS       -CHEST TRAUMA       -ARRYHTHMIAS       -LATE PRESENTING MYOCARDIAL INFARCTION       -COPD   CLINICAL FOLLOW-UP RECOMMENDED. Please note change in reference range.   Urinalysis, Routine w reflex microscopic     Status: Abnormal   Collection Time: 03/24/14 10:30 AM  Result Value Ref Range   Color, Urine YELLOW YELLOW   APPearance CLEAR CLEAR   Specific Gravity, Urine 1.015 1.005 - 1.030   pH 5.5 5.0 - 8.0   Glucose, UA NEGATIVE NEGATIVE mg/dL   Hgb urine dipstick NEGATIVE NEGATIVE   Bilirubin Urine NEGATIVE NEGATIVE   Ketones, ur NEGATIVE NEGATIVE mg/dL   Protein, ur NEGATIVE NEGATIVE mg/dL   Urobilinogen, UA 0.2 0.0 - 1.0 mg/dL   Nitrite POSITIVE (A) NEGATIVE   Leukocytes, UA NEGATIVE NEGATIVE  Urine microscopic-add on     Status: Abnormal   Collection Time: 03/24/14 10:30 AM  Result Value Ref Range   Squamous Epithelial / LPF RARE RARE   WBC, UA 7-10 <3 WBC/hpf   RBC / HPF 0-2 <3 RBC/hpf   Bacteria, UA MANY (A) RARE  Brain natriuretic peptide     Status: Abnormal   Collection Time: 03/24/14 12:00 PM  Result Value Ref Range   B Natriuretic Peptide 381.9 (H) 0.0 - 100.0 pg/mL    Comment: Please note change in reference range.   Labs are reviewed and are pertinent for as seen as above.  Current Facility-Administered Medications  Medication Dose Route Frequency Provider Last Rate Last Dose  . acetaminophen (TYLENOL) tablet 650 mg  650 mg Oral Q4H PRN Malvin Johns, MD      . cephALEXin (KEFLEX) capsule 500 mg  500 mg Oral Q12H Malvin Johns, MD   500 mg at 03/25/14 1300  . citalopram (CELEXA) tablet 20 mg  20 mg  Oral Daily Malvin Johns, MD      . clonazePAM Bobbye Charleston) tablet 0.25 mg  0.25 mg Oral BID PRN Malvin Johns, MD   0.25 mg at 03/25/14 1301  .  gabapentin (NEURONTIN) capsule 100 mg  100 mg Oral Daily Malvin Johns, MD   100 mg at 03/25/14 1301  . [START ON 03/26/2014] levothyroxine (SYNTHROID, LEVOTHROID) tablet 50 mcg  50 mcg Oral QAC breakfast Malvin Johns, MD      . metoprolol (LOPRESSOR) tablet 25 mg  25 mg Oral BID Malvin Johns, MD   25 mg at 03/25/14 1302  . pantoprazole (PROTONIX) EC tablet 40 mg  40 mg Oral Daily Malvin Johns, MD   40 mg at 03/25/14 1300  . rivastigmine (EXELON) 4.6 mg/24hr 4.6 mg  4.6 mg Transdermal Daily Malvin Johns, MD       Current Outpatient Prescriptions  Medication Sig Dispense Refill  . acetaminophen (TYLENOL) 325 MG tablet Take 650 mg by mouth every 6 (six) hours as needed for mild pain.     . citalopram (CELEXA) 20 MG tablet Take 20 mg by mouth daily.    . clonazePAM (KLONOPIN) 0.5 MG tablet Take 0.25 mg by mouth 2 (two) times daily as needed for anxiety.    . gabapentin (NEURONTIN) 100 MG capsule Take 100 mg by mouth daily.     Marland Kitchen guaifenesin (ROBITUSSIN) 100 MG/5ML syrup Take 200 mg by mouth 4 (four) times daily as needed for cough.    . levothyroxine (SYNTHROID, LEVOTHROID) 50 MCG tablet Take 50 mcg by mouth daily before breakfast.    . loperamide (IMODIUM) 2 MG capsule Take 2 mg by mouth as needed for diarrhea or loose stools.    . Melatonin ([employer] SLEEP AID) 5 MG TABS Take 5 mg by mouth at bedtime as needed (for sleep).    . metoprolol tartrate (LOPRESSOR) 25 MG tablet Take 12.5 mg by mouth 2 (two) times daily.     . pantoprazole (PROTONIX) 40 MG tablet Take 40 mg by mouth 2 (two) times daily.     . rivastigmine (EXELON) 4.6 mg/24hr Place 4.6 mg onto the skin daily.      Psychiatric Specialty Exam:     Blood pressure 158/98, pulse 97, temperature 97.5 F (36.4 C), temperature source Oral, resp. rate 16, weight 61.236 kg (135 lb), SpO2 95 %.Body mass index is 27.25 kg/(m^2).  General Appearance: Casual  Eye Contact::  Fair  Speech:  Clear and Coherent, Slow and short answers yes/no   Volume:  Decreased  Mood:  Anxious and Depressed  Affect:  Congruent, Depressed and Flat  Thought Process:  Disorganized and Answered questions about self and family correctly, knew her son and stated the name of her daughter.  Orientation:  Other:  Did not know which hospital she was in.  Knew she stays at an ALF  Thought Content:  WDL  Suicidal Thoughts:  No  Homicidal Thoughts:  No  Memory:  Immediate;   Fair Recent;   Poor Remote;   Poor  Judgement:  Impaired  Insight:  Fair  Psychomotor Activity:  Normal and attempts to get out of her bed to walk.  Concentration:  Poor  Recall:  Poor  Fund of Knowledge:Poor  Language: Fair  Akathisia:  NA  Handed:  Right  AIMS (if indicated):     Assets:  Desire for Improvement  Sleep:      Musculoskeletal: Strength & Muscle Tone: Seen in bed resting Gait & Station: seen in bed resting  Patient leans: seen in bed resting.  Treatment Plan Summary: Daily contact with patient to assess and evaluate symptoms and progress in treatment Medication management Accepted for admission, will seek placement at a Gero-psychiatric unit for safety and stabilization.  Hold all Psychotropic medications at this time.  Charmaine Downs, C 03/25/2014 4:35 PM   Patient seen face-to-face for psychiatric evaluation and case discussed with the physician extender and formulated treatment plan. Reviewed the information documented and agree with the treatment plan.  Renel Ende,JANARDHAHA R. 03/26/2014 2:13 PM

## 2014-03-25 NOTE — BH Assessment (Addendum)
[facility] Assessment Progress Note [facility] Dr. Jeannine KittenFarah per Dannielle Huhanny due to unit acuilty.

## 2014-03-25 NOTE — ED Notes (Signed)
Pt trying to get out of bed unsuccessfully.  She has been redirected and needs have been attended too.  (Water given).  Pt's skin evaluated under posey.

## 2014-03-25 NOTE — Progress Notes (Signed)
CSW faxed referral to:  [facility] who reported had open beds.   Adelene AmasEdith Huxton Glaus, LCSW Disposition Social Worker [phone]

## 2014-03-25 NOTE — ED Notes (Signed)
[phone] Janice NeasRick Miller son POA

## 2014-03-25 NOTE — ED Provider Notes (Addendum)
Care taken over from prior EDP.  Pt came in from assisted living facility for altered MS.  Initially was found to have a UTI and has been awaiting a bed at [facility].  Has been awaiting a bed all night and it appears that it may be several more hours before a bed becomes available.  [facility]  Pt's other labs are unremarkable, creatinine at baseline, VSS.  Son here who states that pt has had recurrent UTIs and this he is concerned that her current behavior is more psychiatric in nature.  She has a hx of Bipolar d/o as well as dementia and over the last 10 days, has been manic, pacing the halls at night, disturbing the other residents, calling 911 numerous times and sleeping during the day.  She has been very agitated and aggressive. Plan is to get TTS consult.  9:34 pt has been assessed by telepsych and discussed with psych MLP who recommends geripsych placement.  Spoke with Patty and [facility] Denton [facility]l [facility], [facility] as pt has been here for 24 hours.  [facility] with [facility] who still do not have an inpt bed openings.  Will cancel bed there.  Rolan BuccoMelanie Debra Calabretta, MD 03/25/14 0800  Rolan BuccoMelanie Jarris Kortz, MD 03/25/14 [phone]

## 2014-03-25 NOTE — BH Assessment (Signed)
Janice Headonrad Withrow, PA-C recommended Pt be transferred to geriatric-psychiatry unit. Contacted the following facilities for placement:  INFORMATION FAXED, PT UNDER REVIEW: [facility]  AT CAPACITY: [facility], [facility], [place]  NO RESPONSE: [facility]  PT DECLINED: [facility]   [address] CircleF[address] Ellis Patsy BaltimoreWarrick Jr, Wisconsin[place]PC, [facility] Triage Specialist [phone]

## 2014-03-25 NOTE — ED Provider Notes (Signed)
[facility] transferred by EDP there/Dr Fredderick PhenixBelfi, to hold for psych team placement.  Pt awake and alert. Denies specific c/o or pain.  Filed Vitals:   03/25/14 1004  BP: 150/74  Pulse: 96  Temp:   Resp: 18   Psych team to work on placement, possibly to [place].     Suzi RootsKevin E Jaszmine Navejas, MD 03/25/14 [phone]

## 2014-03-25 NOTE — ED Notes (Signed)
Pt resting quietly.  NAD.  Posey in correct position.  Pt opens eyes to voice.  She reports she is sleepy.

## 2014-03-26 DIAGNOSIS — N390 Urinary tract infection, site not specified: Secondary | ICD-10-CM

## 2014-03-26 DIAGNOSIS — F068 Other specified mental disorders due to known physiological condition: Secondary | ICD-10-CM | POA: Diagnosis present

## 2014-03-26 DIAGNOSIS — F29 Unspecified psychosis not due to a substance or known physiological condition: Secondary | ICD-10-CM

## 2014-03-26 DIAGNOSIS — R4182 Altered mental status, unspecified: Secondary | ICD-10-CM | POA: Diagnosis not present

## 2014-03-26 LAB — CBC WITH DIFFERENTIAL/PLATELET
BASOS PCT: 0 % (ref 0–1)
Basophils Absolute: 0 10*3/uL (ref 0.0–0.1)
Eosinophils Absolute: 2 10*3/uL — ABNORMAL HIGH (ref 0.0–0.7)
Eosinophils Relative: 18 % — ABNORMAL HIGH (ref 0–5)
HCT: 39 % (ref 36.0–46.0)
Hemoglobin: 12.5 g/dL (ref 12.0–15.0)
LYMPHS ABS: 1.2 10*3/uL (ref 0.7–4.0)
Lymphocytes Relative: 11 % — ABNORMAL LOW (ref 12–46)
MCH: 31.1 pg (ref 26.0–34.0)
MCHC: 32.1 g/dL (ref 30.0–36.0)
MCV: 97 fL (ref 78.0–100.0)
MONO ABS: 0.7 10*3/uL (ref 0.1–1.0)
MONOS PCT: 7 % (ref 3–12)
Neutro Abs: 7.1 10*3/uL (ref 1.7–7.7)
Neutrophils Relative %: 64 % (ref 43–77)
PLATELETS: 240 10*3/uL (ref 150–400)
RBC: 4.02 MIL/uL (ref 3.87–5.11)
RDW: 12.7 % (ref 11.5–15.5)
WBC: 11 10*3/uL — AB (ref 4.0–10.5)

## 2014-03-26 LAB — URINALYSIS, ROUTINE W REFLEX MICROSCOPIC
BILIRUBIN URINE: NEGATIVE
GLUCOSE, UA: NEGATIVE mg/dL
Ketones, ur: NEGATIVE mg/dL
Nitrite: NEGATIVE
PH: 5.5 (ref 5.0–8.0)
PROTEIN: NEGATIVE mg/dL
Specific Gravity, Urine: 1.009 (ref 1.005–1.030)
UROBILINOGEN UA: 0.2 mg/dL (ref 0.0–1.0)

## 2014-03-26 LAB — BASIC METABOLIC PANEL
ANION GAP: 13 (ref 5–15)
BUN: 28 mg/dL — ABNORMAL HIGH (ref 6–23)
CHLORIDE: 102 meq/L (ref 96–112)
CO2: 20 mmol/L (ref 19–32)
CREATININE: 1.99 mg/dL — AB (ref 0.50–1.10)
Calcium: 9.3 mg/dL (ref 8.4–10.5)
GFR calc non Af Amer: 22 mL/min — ABNORMAL LOW (ref >90)
GFR, EST AFRICAN AMERICAN: 25 mL/min — AB (ref >90)
GLUCOSE: 94 mg/dL (ref 70–99)
Potassium: 4.3 mmol/L (ref 3.5–5.1)
Sodium: 135 mmol/L (ref 135–145)

## 2014-03-26 LAB — URINE MICROSCOPIC-ADD ON

## 2014-03-26 NOTE — ED Notes (Addendum)
Pt awake. Verbally responsive. A/O x1 to self. Resp even and unlabored. ABC's intact. Pt constantly repeating "Help me, help me". Pt noted incontinent of bladder. Pt dried and applied house cream. NAD noted. Posey belt intact. Skin w/d and intact.

## 2014-03-26 NOTE — ED Notes (Signed)
Pt attempting to crawl OOB. Pt assisted to recliner and brought at NS for closer monitoring. Pt stating that she was wet and pt was dried x2 in 1hr. Posey restraint intact.

## 2014-03-26 NOTE — ED Notes (Signed)
Pt ate 50% of her dinner

## 2014-03-26 NOTE — ED Provider Notes (Signed)
Called to assessed patient for decreased activity level and difficult to assess for psych PA due to staying asleep and poorly responsive. Nursing staff reports that yesterday the patient was very active and up and out of bed. The only dosing apparently for sedation was one dose of Klonopin at about 1600 yesterday and one at midnight last night which was approximately 12 hours ago. Last dose of antipsychotic medication appears to been 2 days ago with a IM dose of Haldol. The daughter reports that the patient seems more somnolent than would be normal for her and more confused. The patient does awaken to voice. She answers questions however she gets some inappropriate responses. She seems generally fatigued and quiet in the bed. She shows no signs of respiratory distress. Her heart is regular and her lungs are clear. There is no abdominal tenderness to palpation. She does not have any peripheral edema. I do not appreciate any redness of the skin or cellulitis.  At this time labs will be rechecked and determination will be made if patient needs to be transitioned back into a medical floor versus continued management in psychiatric care.  At this time diagnostic studies and vital signs do not show any changes to suggest changing medical condition. Patient does have known dementia. At this point I do suspect increased disorientation due to hospitalization and changed environment. The in-place plan is to find geriatric psychiatric treatment.  Arby BarretteMarcy Emoni Whitworth, MD 03/26/14 [phone]

## 2014-03-26 NOTE — Consult Note (Signed)
[facility] Face-to-Face Psychiatry Consult   Reason for Consult:  Psychosis Referring Physician:  EDP  [name] is an 81 y.o. female. 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 Stomach irritation   . Diverticulitis   . Hypertension   . Depression   . Hypothyroid   . GERD (gastroesophageal reflux disease)   . Narcotic abuse     Vicodin - last use 07/2012  . Chronic diarrhea   . H/O small bowel obstruction   . Acute kidney failure   . UTI (lower urinary tract infection)   . Episodic mood disorder   . Anxiety   . Insomnia   . Debility   . Generalized pain    AXIS IV:  chronic UTIs AXIS V:  61-70 mild symptoms for psychiatry  Plan:  No evidence of imminent risk to self or others at present.  Treat UTI medically, if behavior/psychosis has not improved then consult psychiatry.  Subjective:   [name] is a 81 y.o. female patient has an UTI affecting her behavior.  HPI:  The patient has a long history of UTIs with recent behavior changes.  Positive for UTI on admission, behaviors should dissipate when infection resolves.  HPI Elements:   Location:  generalized. Quality:  acute. Severity:  moderate. Timing:  constant. Duration:  10 days. Context:  UTI.  Past Psychiatric History: Past Medical History  Diagnosis Date  . Stomach irritation   . Diverticulitis   . Hypertension   . Depression   . Hypothyroid   . GERD (gastroesophageal reflux disease)   . Narcotic abuse     Vicodin - last use 07/2012  . Chronic diarrhea   . H/O small bowel obstruction   . Acute kidney failure   . UTI (lower urinary tract infection)   . Episodic mood disorder   . Anxiety   . Insomnia   . Debility   . Generalized pain     reports that she has never smoked. She has never used smokeless tobacco. She reports that she does not drink alcohol or use illicit  drugs. Family History  Problem Relation Age of Onset  . Hypertension Sister   . Hyperlipidemia Neg Hx   . Diabetes Neg Hx    Family History Substance Abuse: No Family Supports: Yes, List: (son, dgtr) Living Arrangements:  (assisted living) Can pt return to current living arrangement?: Yes Abuse/Neglect [facility]) Physical Abuse: Denies Verbal Abuse: Denies Sexual Abuse: Denies Allergies:   Allergies  Allergen Reactions  . Ace Inhibitors Cough  . Morphine And Related     unknown  . Oxymorphone     unknown  . Quetiapine     Dementia like symptoms    ACT Assessment Complete:  Yes:    Educational Status    Risk to Self: Risk to self with the past 6 months Suicidal Ideation: No Suicidal Intent: No Is patient at risk for suicide?: No Suicidal Plan?: No Access to Means: No What has been your use of drugs/alcohol within the last 12 months?:  (none) Previous Attempts/Gestures: Yes How many times?:  (1x) Other Self Harm Risks:  (walking ) Triggers for Past Attempts: Unknown Intentional Self Injurious Behavior: None Family Suicide History: Unknown Recent stressful life event(s):  (none known) Persecutory voices/beliefs?: No Depression: Yes Depression Symptoms: Insomnia, Feeling angry/irritable, Isolating Substance abuse history and/or treatment for substance  abuse?: Yes Suicide prevention information given to non-admitted patients: Not applicable  Risk to Others: Risk to Others within the past 6 months Homicidal Ideation: No Thoughts of Harm to Others: No Current Homicidal Intent: No Current Homicidal Plan: No Access to Homicidal Means: No History of harm to others?: No Assessment of Violence:  (struggling against son, daughter-aggitation) Does patient have access to weapons?: No Criminal Charges Pending?: No Does patient have a court date: No  Abuse: Abuse/Neglect Assessment (Assessment to be complete while patient is alone) Physical Abuse: Denies Verbal Abuse:  Denies Sexual Abuse: Denies Exploitation of patient/patient's resources: Denies Self-Neglect: Denies  Prior Inpatient Therapy: Prior Inpatient Therapy Prior Inpatient Therapy: Yes Prior Therapy Dates:  (Feb) Prior Therapy Facilty/Provider(s):  [facility], [facility])  Prior Outpatient Therapy: Prior Outpatient Therapy Prior Outpatient Therapy: Yes Prior Therapy Dates:  (last year) Prior Therapy Facilty/Provider(s):  (see above)  Additional Information: Additional Information 1:1 In Past 12 Months?: No CIRT Risk: Yes Elopement Risk: Yes Does patient have medical clearance?: Yes                  Objective: Blood pressure 153/75, pulse 64, temperature 97.6 F (36.4 C), temperature source Oral, resp. rate 18, weight 135 lb (61.236 kg), SpO2 94 %.Body mass index is 27.25 kg/(m^2). Results for orders placed or performed during the hospital encounter of 03/24/14 (from the past 72 hour(s))  CBC     Status: Abnormal   Collection Time: 03/24/14 10:20 AM  Result Value Ref Range   WBC 11.7 (H) 4.0 - 10.5 K/uL   RBC 3.75 (L) 3.87 - 5.11 MIL/uL   Hemoglobin 11.8 (L) 12.0 - 15.0 g/dL   HCT 36.9 36.0 - 46.0 %   MCV 98.4 78.0 - 100.0 fL   MCH 31.5 26.0 - 34.0 pg   MCHC 32.0 30.0 - 36.0 g/dL   RDW 12.2 11.5 - 15.5 %   Platelets 244 150 - 400 K/uL  Comprehensive metabolic panel     Status: Abnormal   Collection Time: 03/24/14 10:20 AM  Result Value Ref Range   Sodium 140 135 - 145 mmol/L    Comment: Please note change in reference range.   Potassium 4.5 3.5 - 5.1 mmol/L    Comment: Please note change in reference range.   Chloride 108 96 - 112 mEq/L   CO2 22 19 - 32 mmol/L   Glucose, Bld 87 70 - 99 mg/dL   BUN 32 (H) 6 - 23 mg/dL   Creatinine, Ser 2.30 (H) 0.50 - 1.10 mg/dL   Calcium 9.6 8.4 - 10.5 mg/dL   Total Protein 6.5 6.0 - 8.3 g/dL   Albumin 3.8 3.5 - 5.2 g/dL   AST 19 0 - 37 U/L   ALT 8 0 - 35 U/L   Alkaline Phosphatase 65 39 - 117 U/L   Total Bilirubin 0.9 0.3 - 1.2  mg/dL   GFR calc non Af Amer 18 (L) >90 mL/min   GFR calc Af Amer 21 (L) >90 mL/min    Comment: (NOTE) The eGFR has been calculated using the CKD EPI equation. This calculation has not been validated in all clinical situations. eGFR's persistently <90 mL/min signify possible Chronic Kidney Disease.    Anion gap 10 5 - 15  Troponin I     Status: Abnormal   Collection Time: 03/24/14 10:20 AM  Result Value Ref Range   Troponin I 0.04 (H) <0.031 ng/mL    Comment:        PERSISTENTLY INCREASED  TROPONIN VALUES IN THE RANGE OF 0.04-0.49 ng/mL CAN BE SEEN IN:       -UNSTABLE ANGINA       -CONGESTIVE HEART FAILURE       -MYOCARDITIS       -CHEST TRAUMA       -ARRYHTHMIAS       -LATE PRESENTING MYOCARDIAL INFARCTION       -COPD   CLINICAL FOLLOW-UP RECOMMENDED. Please note change in reference range.   Urinalysis, Routine w reflex microscopic     Status: Abnormal   Collection Time: 03/24/14 10:30 AM  Result Value Ref Range   Color, Urine YELLOW YELLOW   APPearance CLEAR CLEAR   Specific Gravity, Urine 1.015 1.005 - 1.030   pH 5.5 5.0 - 8.0   Glucose, UA NEGATIVE NEGATIVE mg/dL   Hgb urine dipstick NEGATIVE NEGATIVE   Bilirubin Urine NEGATIVE NEGATIVE   Ketones, ur NEGATIVE NEGATIVE mg/dL   Protein, ur NEGATIVE NEGATIVE mg/dL   Urobilinogen, UA 0.2 0.0 - 1.0 mg/dL   Nitrite POSITIVE (A) NEGATIVE   Leukocytes, UA NEGATIVE NEGATIVE  Urine microscopic-add on     Status: Abnormal   Collection Time: 03/24/14 10:30 AM  Result Value Ref Range   Squamous Epithelial / LPF RARE RARE   WBC, UA 7-10 <3 WBC/hpf   RBC / HPF 0-2 <3 RBC/hpf   Bacteria, UA MANY (A) RARE  Brain natriuretic peptide     Status: Abnormal   Collection Time: 03/24/14 12:00 PM  Result Value Ref Range   B Natriuretic Peptide 381.9 (H) 0.0 - 100.0 pg/mL    Comment: Please note change in reference range.   Labs are reviewed and are pertinent for medical issues being addressed.  Current Facility-Administered  Medications  Medication Dose Route Frequency Provider Last Rate Last Dose  . acetaminophen (TYLENOL) tablet 650 mg  650 mg Oral Q4H PRN Malvin Johns, MD      . cephALEXin (KEFLEX) capsule 500 mg  500 mg Oral Q12H Malvin Johns, MD   500 mg at 03/26/14 0903  . citalopram (CELEXA) tablet 20 mg  20 mg Oral Daily Malvin Johns, MD   20 mg at 03/26/14 0903  . clonazePAM (KLONOPIN) tablet 0.25 mg  0.25 mg Oral BID PRN Malvin Johns, MD   0.25 mg at 03/26/14 0053  . gabapentin (NEURONTIN) capsule 100 mg  100 mg Oral Daily Malvin Johns, MD   100 mg at 03/26/14 0903  . levothyroxine (SYNTHROID, LEVOTHROID) tablet 50 mcg  50 mcg Oral QAC breakfast Malvin Johns, MD   50 mcg at 03/26/14 0859  . metoprolol (LOPRESSOR) tablet 25 mg  25 mg Oral BID Malvin Johns, MD   25 mg at 03/26/14 0903  . pantoprazole (PROTONIX) EC tablet 40 mg  40 mg Oral Daily Malvin Johns, MD   40 mg at 03/26/14 0903  . rivastigmine (EXELON) 4.6 mg/24hr 4.6 mg  4.6 mg Transdermal Daily Malvin Johns, MD   4.6 mg at 03/26/14 4680   Current Outpatient Prescriptions  Medication Sig Dispense Refill  . acetaminophen (TYLENOL) 325 MG tablet Take 650 mg by mouth every 6 (six) hours as needed for mild pain.     . citalopram (CELEXA) 20 MG tablet Take 20 mg by mouth daily.    . clonazePAM (KLONOPIN) 0.5 MG tablet Take 0.25 mg by mouth 2 (two) times daily as needed for anxiety.    . gabapentin (NEURONTIN) 100 MG capsule Take 100 mg by mouth daily.     Marland Kitchen guaifenesin (  ROBITUSSIN) 100 MG/5ML syrup Take 200 mg by mouth 4 (four) times daily as needed for cough.    . levothyroxine (SYNTHROID, LEVOTHROID) 50 MCG tablet Take 50 mcg by mouth daily before breakfast.    . loperamide (IMODIUM) 2 MG capsule Take 2 mg by mouth as needed for diarrhea or loose stools.    . Melatonin ([employer] SLEEP AID) 5 MG TABS Take 5 mg by mouth at bedtime as needed (for sleep).    . metoprolol tartrate (LOPRESSOR) 25 MG tablet Take 12.5 mg by mouth 2 (two) times daily.      . pantoprazole (PROTONIX) 40 MG tablet Take 40 mg by mouth 2 (two) times daily.     . rivastigmine (EXELON) 4.6 mg/24hr Place 4.6 mg onto the skin daily.      Psychiatric Specialty Exam:     Blood pressure 153/75, pulse 64, temperature 97.6 F (36.4 C), temperature source Oral, resp. rate 18, weight 135 lb (61.236 kg), SpO2 94 %.Body mass index is 27.25 kg/(m^2).  General Appearance: Casual  Eye Contact::  Good  Speech:  Normal Rate  Volume:  Normal  Mood:  Anxious  Affect:  Congruent  Thought Process:  Confused at times  Orientation:  Other:  alert to self and daughter  Thought Content:  WDL  Suicidal Thoughts:  No  Homicidal Thoughts:  No  Memory:  Immediate;   Poor Recent;   Poor Remote;   Poor  Judgement:  Poor  Insight:  Lacking  Psychomotor Activity:  Decreased  Concentration:  Poor  Recall:  Poor  Fund of Knowledge:Fair  Language: Fair  Akathisia:  No  Handed:  Right  AIMS (if indicated):     Assets:  Financial Resources/Insurance Leisure Time Resilience Social Support  Sleep:      Musculoskeletal: Strength & Muscle Tone: decreased Gait & Station: did not ambulate on assessment Patient leans: N/A  Treatment Plan Summary: Daily contact with patient to assess and evaluate symptoms and progress in treatment Medication management; treat UTI and if behavior/psychosis behaviors don't resolve then consult psychiatry.  Waylan Boga, [place] 03/26/2014 11:17 AM  Patient seen face-to-face for psychiatric evaluation and case discussed with the physician extender and formulated treatment plan. Reviewed the information documented and agree with the treatment plan.  Riot Barrick,JANARDHAHA R. 03/26/2014 2:17 PM

## 2014-03-27 DIAGNOSIS — R4182 Altered mental status, unspecified: Secondary | ICD-10-CM | POA: Diagnosis not present

## 2014-03-27 LAB — URINE CULTURE
Colony Count: NO GROWTH
Culture: NO GROWTH

## 2014-03-27 NOTE — ED Notes (Signed)
Pt ate 25% of her dinner and 120cc.

## 2014-03-27 NOTE — ED Provider Notes (Signed)
08:30- evaluation, for confirmation of medical clearance.  Patient arrived 03/24/2013, to be evaluated for lethargy.  She was recently started on Seroquel.  Initial urinalysis was consistent with UTI and she was started on Keflex.  Unfortunately, urine culture was not sent.  A repeat urinalysis on 03/26/2014, showed improvement, but again, a urine culture was not sent.  Concerned by providers indicated that UTI may have been considering to her symptoms and they wanted her treated prior to consideration for psychiatric placement.  As of yesterday that evaluation plan, was again reinforced in the record written by Ms. Catha NottinghamJamison.  Clearly, there has been plenty of antibiotic given to improve her urinary tract infection.  Yesterday, white blood cell count was elevated at 11.0 with an eosinophilic prevalence.  This would not be consistent with a urinary tract infection elevation of the white blood cell count.  Eosinophilia can be evaluated as an outpatient and has multiple causes.  There is no indication that she has a medical condition, which is contributing to her psychiatric status, as of today.  At this point, she is requiring physical restraint, to keep her in the bed.  She has been incontinent several times.  Examination- elderly female who is alert.  She is being cleansed from urinary incontinence, by nursing staff.  The patient is alert, interactive, and follow simple commands.  Her only complaint is frequent urination, and feeling hot.  Mouth has mildly dry mucous membranes.  Heart regular rate and rhythm, no murmur.  Lungs clear to auscultation.  Abdomen soft and nontender to palpation.  Vital signs reviewed, and are reassuring, this morning.  She has not had a fever since being here.  It is unclear how much she has been able to eat.  Medications  acetaminophen (TYLENOL) tablet 650 mg (not administered)  citalopram (CELEXA) tablet 20 mg (20 mg Oral Not Given 03/26/14 1949)  clonazePAM (KLONOPIN) tablet  0.25 mg (0.25 mg Oral Given 03/26/14 1742)  levothyroxine (SYNTHROID, LEVOTHROID) tablet 50 mcg (50 mcg Oral Given 03/26/14 0859)  metoprolol (LOPRESSOR) tablet 25 mg (25 mg Oral Given 03/26/14 2119)  pantoprazole (PROTONIX) EC tablet 40 mg (40 mg Oral Given 03/26/14 0903)  rivastigmine (EXELON) 4.6 mg/24hr 4.6 mg (4.6 mg Transdermal Not Given 03/26/14 1950)  cephALEXin (KEFLEX) capsule 500 mg (500 mg Oral Given 03/26/14 2119)  gabapentin (NEURONTIN) capsule 100 mg (100 mg Oral Given 03/26/14 0903)  sodium chloride 0.9 % bolus 500 mL (0 mLs Intravenous Stopped 03/24/14 1300)  cefTRIAXone (ROCEPHIN) 1 g in dextrose 5 % 50 mL IVPB (0 g Intravenous Stopped 03/24/14 1250)  aspirin chewable tablet 324 mg (324 mg Oral Given 03/24/14 1214)  cefTRIAXone (ROCEPHIN) 1 G injection (  Duplicate 03/25/14 0714)  LORazepam (ATIVAN) injection 1 mg (1 mg Intravenous Given 03/24/14 1250)  haloperidol lactate (HALDOL) injection 2 mg (2 mg Intravenous Given 03/24/14 1544)    Patient Vitals for the past 24 hrs:  BP Temp Temp src Pulse Resp SpO2  03/27/14 0515 152/62 mmHg 97.7 F (36.5 C) Oral 68 18 98 %  03/26/14 2119 147/64 mmHg 98.1 F (36.7 C) Oral 78 20 99 %  03/26/14 2119 147/64 mmHg - - 78 - -  03/26/14 1728 (!) 132/50 mmHg 97.4 F (36.3 C) Oral 78 20 94 %  03/26/14 1212 143/77 mmHg 97.6 F (36.4 C) Oral 66 16 94 %  03/26/14 0903 153/75 mmHg - - 64 - -    As of this time, the patient is medically cleared for treatment  in a psychiatric facility, or extended care facility or psychiatric geriatric facility; to be determined by the psychiatric team.    Flint Melter, MD 03/27/14 [phone]

## 2014-03-27 NOTE — Consult Note (Signed)
[facility] Face-to-Face Psychiatry Consult   Reason for Consult:  Psychosis Referring Physician:  EDP  [name] is an 81 y.o. female. 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 Stomach irritation   . Diverticulitis   . Hypertension   . Depression   . Hypothyroid   . GERD (gastroesophageal reflux disease)   . Narcotic abuse     Vicodin - last use 07/2012  . Chronic diarrhea   . H/O small bowel obstruction   . Acute kidney failure   . UTI (lower urinary tract infection)   . Episodic mood disorder   . Anxiety   . Insomnia   . Debility   . Generalized pain    AXIS IV:  chronic UTIs AXIS V:  61-70 mild symptoms for psychiatry  Plan:  No evidence of imminent risk to self or others at present.  Treat UTI medically, if behavior/psychosis has not improved then consult psychiatry.  Subjective:   [name] is a 81 y.o. female patient has an UTI affecting her behavior.  HPI:  The patient has a long history of UTIs with recent behavior changes.  Positive for UTI on admission, behaviors should dissipate when infection resolves.  Reviewed note above with updates.  Patient is cleared by EDP this morning.  Patient was seen in bed calm and answered all questions asked of her.  She stated her name, age and the names of her two children. She did not know which facility she is in and why she is here.  We are still looking for her placement at a [facility]  facility.   HPI Elements:   Location:  generalized. Quality:  acute. Severity:  moderate. Timing:  constant. Duration:  10 days. Context:  UTI.  Past Psychiatric History: Past Medical History  Diagnosis Date  . Stomach irritation   . Diverticulitis   . Hypertension   . Depression   . Hypothyroid   . GERD (gastroesophageal reflux disease)   . Narcotic abuse     Vicodin - last use 07/2012  .  Chronic diarrhea   . H/O small bowel obstruction   . Acute kidney failure   . UTI (lower urinary tract infection)   . Episodic mood disorder   . Anxiety   . Insomnia   . Debility   . Generalized pain     reports that she has never smoked. She has never used smokeless tobacco. She reports that she does not drink alcohol or use illicit drugs. Family History  Problem Relation Age of Onset  . Hypertension Sister   . Hyperlipidemia Neg Hx   . Diabetes Neg Hx    Family History Substance Abuse: No Family Supports: Yes, List: (son, dgtr) Living Arrangements:  (assisted living) Can pt return to current living arrangement?: Yes Abuse/Neglect [facility]) Physical Abuse: Denies Verbal Abuse: Denies Sexual Abuse: Denies Allergies:   Allergies  Allergen Reactions  . Ace Inhibitors Cough  . Morphine And Related     unknown  . Oxymorphone     unknown  . Quetiapine     Dementia like symptoms    ACT Assessment Complete:  Yes:    Educational Status    Risk to Self: Risk to self with the past 6 months Suicidal Ideation: No Suicidal Intent: No Is patient at risk for suicide?: No Suicidal Plan?: No Access to Means:  No What has been your use of drugs/alcohol within the last 12 months?:  (none) Previous Attempts/Gestures: Yes How many times?:  (1x) Other Self Harm Risks:  (walking ) Triggers for Past Attempts: Unknown Intentional Self Injurious Behavior: None Family Suicide History: Unknown Recent stressful life event(s):  (none known) Persecutory voices/beliefs?: No Depression: Yes Depression Symptoms: Insomnia, Feeling angry/irritable, Isolating Substance abuse history and/or treatment for substance abuse?: No Suicide prevention information given to non-admitted patients: Not applicable  Risk to Others: Risk to Others within the past 6 months Homicidal Ideation: No Thoughts of Harm to Others: No Current Homicidal Intent: No Current Homicidal Plan: No Access to Homicidal Means:  No History of harm to others?: No Assessment of Violence:  (struggling against son, daughter-aggitation) Does patient have access to weapons?: No Criminal Charges Pending?: No Does patient have a court date: No  Abuse: Abuse/Neglect Assessment (Assessment to be complete while patient is alone) Physical Abuse: Denies Verbal Abuse: Denies Sexual Abuse: Denies Exploitation of patient/patient's resources: Denies Self-Neglect: Denies  Prior Inpatient Therapy: Prior Inpatient Therapy Prior Inpatient Therapy: Yes Prior Therapy Dates:  (Feb) Prior Therapy Facilty/Provider(s):  [facility]  Prior Outpatient Therapy: Prior Outpatient Therapy Prior Outpatient Therapy: Yes Prior Therapy Dates:  (last year) Prior Therapy Facilty/Provider(s):  (see above)  Additional Information: Additional Information 1:1 In Past 12 Months?: No CIRT Risk: Yes Elopement Risk: Yes Does patient have medical clearance?: Yes    Objective: Blood pressure 151/72, pulse 76, temperature 97.8 F (36.6 C), temperature source Oral, resp. rate 16, weight 61.236 kg (135 lb), SpO2 95 %.Body mass index is 27.25 kg/(m^2). Results for orders placed or performed during the hospital encounter of 03/24/14 (from the past 72 hour(s))  Basic metabolic panel     Status: Abnormal   Collection Time: 03/26/14 11:49 AM  Result Value Ref Range   Sodium 135 135 - 145 mmol/L    Comment: Please note change in reference range.   Potassium 4.3 3.5 - 5.1 mmol/L    Comment: Please note change in reference range.   Chloride 102 96 - 112 mEq/L   CO2 20 19 - 32 mmol/L   Glucose, Bld 94 70 - 99 mg/dL   BUN 28 (H) 6 - 23 mg/dL   Creatinine, Ser 1.99 (H) 0.50 - 1.10 mg/dL   Calcium 9.3 8.4 - 10.5 mg/dL   GFR calc non Af Amer 22 (L) >90 mL/min   GFR calc Af Amer 25 (L) >90 mL/min    Comment: (NOTE) The eGFR has been calculated using the CKD EPI equation. This calculation has not been validated in all clinical situations. eGFR's  persistently <90 mL/min signify possible Chronic Kidney Disease.    Anion gap 13 5 - 15  CBC with Differential     Status: Abnormal   Collection Time: 03/26/14 11:49 AM  Result Value Ref Range   WBC 11.0 (H) 4.0 - 10.5 K/uL   RBC 4.02 3.87 - 5.11 MIL/uL   Hemoglobin 12.5 12.0 - 15.0 g/dL   HCT 39.0 36.0 - 46.0 %   MCV 97.0 78.0 - 100.0 fL   MCH 31.1 26.0 - 34.0 pg   MCHC 32.1 30.0 - 36.0 g/dL   RDW 12.7 11.5 - 15.5 %   Platelets 240 150 - 400 K/uL   Neutrophils Relative % 64 43 - 77 %   Neutro Abs 7.1 1.7 - 7.7 K/uL   Lymphocytes Relative 11 (L) 12 - 46 %   Lymphs Abs 1.2 0.7 - 4.0 K/uL  Monocytes Relative 7 3 - 12 %   Monocytes Absolute 0.7 0.1 - 1.0 K/uL   Eosinophils Relative 18 (H) 0 - 5 %   Eosinophils Absolute 2.0 (H) 0.0 - 0.7 K/uL   Basophils Relative 0 0 - 1 %   Basophils Absolute 0.0 0.0 - 0.1 K/uL  Urinalysis, Routine w reflex microscopic     Status: Abnormal   Collection Time: 03/26/14 12:19 PM  Result Value Ref Range   Color, Urine YELLOW YELLOW   APPearance CLEAR CLEAR   Specific Gravity, Urine 1.009 1.005 - 1.030   pH 5.5 5.0 - 8.0   Glucose, UA NEGATIVE NEGATIVE mg/dL   Hgb urine dipstick TRACE (A) NEGATIVE   Bilirubin Urine NEGATIVE NEGATIVE   Ketones, ur NEGATIVE NEGATIVE mg/dL   Protein, ur NEGATIVE NEGATIVE mg/dL   Urobilinogen, UA 0.2 0.0 - 1.0 mg/dL   Nitrite NEGATIVE NEGATIVE   Leukocytes, UA MODERATE (A) NEGATIVE  Urine microscopic-add on     Status: None   Collection Time: 03/26/14 12:19 PM  Result Value Ref Range   Squamous Epithelial / LPF RARE RARE   WBC, UA 11-20 <3 WBC/hpf   RBC / HPF 0-2 <3 RBC/hpf   Labs are reviewed and are pertinent for medical issues being addressed.  Current Facility-Administered Medications  Medication Dose Route Frequency Provider Last Rate Last Dose  . acetaminophen (TYLENOL) tablet 650 mg  650 mg Oral Q4H PRN Malvin Johns, MD      . cephALEXin (KEFLEX) capsule 500 mg  500 mg Oral Q12H Malvin Johns, MD    500 mg at 03/27/14 0950  . citalopram (CELEXA) tablet 20 mg  20 mg Oral Daily Malvin Johns, MD   20 mg at 03/27/14 0950  . clonazePAM (KLONOPIN) tablet 0.25 mg  0.25 mg Oral BID PRN Malvin Johns, MD   0.25 mg at 03/26/14 1742  . gabapentin (NEURONTIN) capsule 100 mg  100 mg Oral Daily Malvin Johns, MD   100 mg at 03/27/14 0950  . levothyroxine (SYNTHROID, LEVOTHROID) tablet 50 mcg  50 mcg Oral QAC breakfast Malvin Johns, MD   50 mcg at 03/27/14 0844  . metoprolol (LOPRESSOR) tablet 25 mg  25 mg Oral BID Malvin Johns, MD   25 mg at 03/27/14 0950  . pantoprazole (PROTONIX) EC tablet 40 mg  40 mg Oral Daily Malvin Johns, MD   40 mg at 03/27/14 0950  . rivastigmine (EXELON) 4.6 mg/24hr 4.6 mg  4.6 mg Transdermal Daily Malvin Johns, MD   4.6 mg at 03/27/14 2706   Current Outpatient Prescriptions  Medication Sig Dispense Refill  . acetaminophen (TYLENOL) 325 MG tablet Take 650 mg by mouth every 6 (six) hours as needed for mild pain.     . citalopram (CELEXA) 20 MG tablet Take 20 mg by mouth daily.    . clonazePAM (KLONOPIN) 0.5 MG tablet Take 0.25 mg by mouth 2 (two) times daily as needed for anxiety.    . gabapentin (NEURONTIN) 100 MG capsule Take 100 mg by mouth daily.     Marland Kitchen guaifenesin (ROBITUSSIN) 100 MG/5ML syrup Take 200 mg by mouth 4 (four) times daily as needed for cough.    . levothyroxine (SYNTHROID, LEVOTHROID) 50 MCG tablet Take 50 mcg by mouth daily before breakfast.    . loperamide (IMODIUM) 2 MG capsule Take 2 mg by mouth as needed for diarrhea or loose stools.    . Melatonin ([employer] SLEEP AID) 5 MG TABS Take 5 mg by mouth at bedtime as  needed (for sleep).    . metoprolol tartrate (LOPRESSOR) 25 MG tablet Take 12.5 mg by mouth 2 (two) times daily.     . pantoprazole (PROTONIX) 40 MG tablet Take 40 mg by mouth 2 (two) times daily.     . rivastigmine (EXELON) 4.6 mg/24hr Place 4.6 mg onto the skin daily.      Psychiatric Specialty Exam:     Blood pressure 151/72, pulse 76,  temperature 97.8 F (36.6 C), temperature source Oral, resp. rate 16, weight 61.236 kg (135 lb), SpO2 95 %.Body mass index is 27.25 kg/(m^2).  General Appearance: Casual  Eye Contact::  Good  Speech:  Normal Rate  Volume:  Normal  Mood:  Anxious  Affect:  Congruent  Thought Process:  Confused at times  Orientation:  Other:  alert to self and daughter  Thought Content:  WDL  Suicidal Thoughts:  No  Homicidal Thoughts:  No  Memory:  Immediate;   Poor Recent;   Poor Remote;   Poor  Judgement:  Poor  Insight:  Lacking  Psychomotor Activity:  Decreased  Concentration:  Poor  Recall:  Poor  Fund of Knowledge:Fair  Language: Fair  Akathisia:  No  Handed:  Right  AIMS (if indicated):     Assets:  Financial Resources/Insurance Leisure Time Resilience Social Support  Sleep:      Musculoskeletal: Strength & Muscle Tone: decreased Gait & Station: did not ambulate on assessment Patient leans: N/A  Treatment Plan Summary: Daily contact with patient to assess and evaluate symptoms and progress in treatment Medication management; treat UTI and if behavior/psychosis behaviors don't resolve then consult psychiatry.  Delfin Gant, PMHNP-BC 03/27/2014 3:37 PM   Patient seen, evaluated and I agree with notes by Nurse Practitioner. Corena Pilgrim, MD

## 2014-03-27 NOTE — Progress Notes (Addendum)
Pt declined at [facility] due to acuity.  [facility] at capacity as of 11:03am 03/27/14.  Chad CordialLauren Carter, LCSWA 03/27/2014 11:01 AM

## 2014-03-27 NOTE — ED Notes (Signed)
Pt was agitated with posey belt earlier this morning tying to get of bed. Contacted Financial risk analystcharge nurse for sitter. Fed pt breakfast and gave medication with apple sauce. Changed pt with help from other staff. Offered support and redirection. Sitter has now arrived on unit and is sitting with pt at bed side. Safety maintained.

## 2014-03-27 NOTE — ED Notes (Signed)
[name] [phone] is the pt's son and left his contact number if needed.

## 2014-03-27 NOTE — ED Notes (Signed)
Pt continues to attempt to get OOB without assistance singing "help me, help me, help me, help me." When writer asks pt what she needs help with pt responds "So, you will shut up." Spoke to Dr. Read DriversMolpus, order for non-violent restraint given.

## 2014-03-28 DIAGNOSIS — F319 Bipolar disorder, unspecified: Secondary | ICD-10-CM | POA: Diagnosis present

## 2014-03-28 DIAGNOSIS — F250 Schizoaffective disorder, bipolar type: Secondary | ICD-10-CM

## 2014-03-28 DIAGNOSIS — R4182 Altered mental status, unspecified: Secondary | ICD-10-CM | POA: Diagnosis not present

## 2014-03-28 LAB — COMPREHENSIVE METABOLIC PANEL
ALBUMIN: 3.8 g/dL (ref 3.5–5.2)
ALK PHOS: 65 U/L (ref 39–117)
ALT: 9 U/L (ref 0–35)
AST: 23 U/L (ref 0–37)
Anion gap: 10 (ref 5–15)
BILIRUBIN TOTAL: 0.7 mg/dL (ref 0.3–1.2)
BUN: 33 mg/dL — AB (ref 6–23)
CHLORIDE: 105 meq/L (ref 96–112)
CO2: 19 mmol/L (ref 19–32)
CREATININE: 2.11 mg/dL — AB (ref 0.50–1.10)
Calcium: 9.2 mg/dL (ref 8.4–10.5)
GFR calc Af Amer: 23 mL/min — ABNORMAL LOW (ref >90)
GFR, EST NON AFRICAN AMERICAN: 20 mL/min — AB (ref >90)
Glucose, Bld: 107 mg/dL — ABNORMAL HIGH (ref 70–99)
POTASSIUM: 4.1 mmol/L (ref 3.5–5.1)
Sodium: 134 mmol/L — ABNORMAL LOW (ref 135–145)
Total Protein: 6.5 g/dL (ref 6.0–8.3)

## 2014-03-28 LAB — CBC
HEMATOCRIT: 38.9 % (ref 36.0–46.0)
HEMOGLOBIN: 12.3 g/dL (ref 12.0–15.0)
MCH: 31.2 pg (ref 26.0–34.0)
MCHC: 31.6 g/dL (ref 30.0–36.0)
MCV: 98.7 fL (ref 78.0–100.0)
Platelets: 244 10*3/uL (ref 150–400)
RBC: 3.94 MIL/uL (ref 3.87–5.11)
RDW: 12.9 % (ref 11.5–15.5)
WBC: 11.9 10*3/uL — ABNORMAL HIGH (ref 4.0–10.5)

## 2014-03-28 MED ORDER — SODIUM CHLORIDE 0.9 % IV SOLN
Freq: Once | INTRAVENOUS | Status: AC
  Administered 2014-03-28: 12:00:00 via INTRAVENOUS

## 2014-03-28 NOTE — Progress Notes (Signed)
CSW faxed out referral information to the following hospitals in order to try and obtain a bed for admission : [facility], [facility], [facility] , [facility], [facility], [facility], and [facility].  CSW will check in all hospitals to ensure that they received all required documents.  Trish MageBrittney Kristopher Attwood, LCSWA [phone] ED CSW 03/28/2014 5:28 PM

## 2014-03-28 NOTE — Progress Notes (Signed)
Per request of [facility] admission staff, CSW notified attending RN that new labs would need to be drawn as Pt was being considered for admission.  CSW to follow-up.  Chad CordialLauren Carter, LCSWA 03/28/2014 9:51 AM

## 2014-03-28 NOTE — Progress Notes (Addendum)
[facility] ED MC spoke with son, Janice NeasRick Miller [phone] at ED nursing station about disposition of pt.  Pt labs returned EDP, [name] states pt medically clear.  At 1030 (Dr Jannifer FranklinAkintayo) & 1455 [name]([name], [name]) [facility]. Cm reviewed with son medicare guidelines for 3 day qualifying stay for snf placement [facility] [place] and son reports pt can not afford private duty nursing nor private pay to a snf.  Reports pt with hx of SI (pills) and psychosis dx. Pt does have hx of dementia and is seen by neurologist Son took pt to see neurologist on last week. Pt recently had been on risperdal, risperdal stopped, pt placed on seroquel and seroquel has been stopped.  Cm explained to son also palliative and hospice services after he states pt is "DNR", refusing to eat or drink and ED staff can not force her to eat or drink.  Son voiced understanding about LOS, bed searches, few geriatric behavior health facility in area, medicare guidelines, hospice/palliative care. Son wants to be called with updates and prior to a d/c. Son states pt has [facility] [place]  6 months ago but has not had an inpatient admission in last 30 days to any hospital. Son works at [employer] in [place]   CM spoke with [name] SW who spoke with [name] who reported pt labs to be sent to [facility]   1459 CM spoke with [name], SW at [phone] who states pt new labs need to be [facility]

## 2014-03-28 NOTE — ED Notes (Signed)
Passive ROM performed with patient's legs. Patient c/o leg cramps.  Patient's legs massaged with some relief.

## 2014-03-28 NOTE — ED Notes (Addendum)
Patient complained of being wet.  Her diaper was changed and barrier cream applied.  Patient has non-blanching redness on left groin area.  Patient rolled to right side and propped up with pillows.

## 2014-03-28 NOTE — ED Provider Notes (Signed)
Patient in no distress this AM.  Psych rec's are now that the patient would be more appropriate for a medical placement.  Patient has previously be in NH, but was seemingly removed and transitioned to family care.  Labs demonstrate ongoing renal insufficiency, but not acute failure.  There is also some continuing e/o UTI.  Patient will receive IVF, and we will discuss her case w CM for placement options.  Gerhard Munchobert Jakevious Hollister, MD 03/28/14 1025

## 2014-03-28 NOTE — ED Notes (Signed)
Patient's son and granddaughter at bedside.  Patient ate several bites of mashed potatoes with repeated prompting.  Patient states she "just wants to go on."

## 2014-03-28 NOTE — Progress Notes (Signed)
Recommendation for Quality Collaborative meeting; Pending labs-Evaluation for admission [facility]  Late entry for 1030 ED CM spoke with Dr Jeraldine LootsLockwood about need for further evaluation & treatment for medical clearance; IV fluids to be giving and labs re checked

## 2014-03-28 NOTE — Progress Notes (Signed)
CSW attempting to fax updated lab results but fax is not going through. Staff at [facility] report busy fax machine and request that we keep attempting to fax updated results.   Chad CordialLauren Carter, LCSWA 03/28/2014 2:53 PM

## 2014-03-28 NOTE — Progress Notes (Addendum)
At 1529 received confirmation that fax received at [phone] [facility] and not received at [phone] (admissions) CM spoke with Delorise ShinerGrace at [facility] in admission [phone] and informed they are having concerns with fax machine Provided an alternative fax number as [phone] as the nursing station fax to send updated information for pt   Delorise ShinerGrace states at 1515 there are no available beds but expect discharges on 03/29/14

## 2014-03-28 NOTE — ED Notes (Signed)
Sitter arrived to sit with patient.  

## 2014-03-28 NOTE — Consult Note (Signed)
[facility] Face-to-Face Psychiatry Consult   Reason for Consult:  Psychosis Referring Physician:  EDP  [name] is an 81 y.o. female. 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 Stomach irritation   . Diverticulitis   . Hypertension   . Depression   . Hypothyroid   . GERD (gastroesophageal reflux disease)   . Narcotic abuse     Vicodin - last use 07/2012  . Chronic diarrhea   . H/O small bowel obstruction   . Acute kidney failure   . UTI (lower urinary tract infection)   . Episodic mood disorder   . Anxiety   . Insomnia   . Debility   . Generalized pain    AXIS IV:  chronic UTIs AXIS V:  Serious  Admit to Geropsychiatry for stabilization  Subjective:   [name] is a 81 y.o. female patient has an UTI affecting her behavior.  HPI:  The patient has a history of Bipolar affective disorder, urinary tract infections, and dementia along with Acute kidney failure.  This morning she was not eating or drinking, more labs were ordered along with IV fluids.  Her son keeps pushing for psychiatric admission.  UTI treatment in place.  HPI Elements:   Location:  generalized. Quality:  acute. Severity:  moderate. Timing:  constant. Duration:  10 days. Context:  UTI.  Past Psychiatric History: Past Medical History  Diagnosis Date  . Stomach irritation   . Diverticulitis   . Hypertension   . Depression   . Hypothyroid   . GERD (gastroesophageal reflux disease)   . Narcotic abuse     Vicodin - last use 07/2012  . Chronic diarrhea   . H/O small bowel obstruction   . Acute kidney failure   . UTI (lower urinary tract infection)   . Episodic mood disorder   . Anxiety   . Insomnia   . Debility   . Generalized pain     reports that she has never smoked. She has never used smokeless tobacco. She reports that she does not  drink alcohol or use illicit drugs. Family History  Problem Relation Age of Onset  . Hypertension Sister   . Hyperlipidemia Neg Hx   . Diabetes Neg Hx    Family History Substance Abuse: No Family Supports: Yes, List: (son, dgtr) Living Arrangements:  (assisted living) Can pt return to current living arrangement?: Yes Abuse/Neglect [facility]) Physical Abuse: Denies Verbal Abuse: Denies Sexual Abuse: Denies Allergies:   Allergies  Allergen Reactions  . Ace Inhibitors Cough  . Morphine And Related     unknown  . Oxymorphone     unknown  . Quetiapine     Dementia like symptoms    ACT Assessment Complete:  Yes:    Educational Status    Risk to Self: Risk to self with the past 6 months Suicidal Ideation: No Suicidal Intent: No Is patient at risk for suicide?: No Suicidal Plan?: No Access to Means: No What has been your use of drugs/alcohol within the last 12 months?:  (none) Previous Attempts/Gestures: Yes How many times?:  (1x) Other Self Harm Risks:  (walking ) Triggers for Past Attempts: Unknown Intentional Self Injurious Behavior: None Family Suicide History: Unknown Recent stressful life event(s):  (none known) Persecutory voices/beliefs?: No Depression: Yes Depression Symptoms: Insomnia, Feeling angry/irritable, Isolating Substance abuse history  and/or treatment for substance abuse?: No Suicide prevention information given to non-admitted patients: Not applicable  Risk to Others: Risk to Others within the past 6 months Homicidal Ideation: No Thoughts of Harm to Others: No Current Homicidal Intent: No Current Homicidal Plan: No Access to Homicidal Means: No History of harm to others?: No Assessment of Violence:  (struggling against son, daughter-aggitation) Does patient have access to weapons?: No Criminal Charges Pending?: No Does patient have a court date: No  Abuse: Abuse/Neglect Assessment (Assessment to be complete while patient is alone) Physical Abuse:  Denies Verbal Abuse: Denies Sexual Abuse: Denies Exploitation of patient/patient's resources: Denies Self-Neglect: Denies  Prior Inpatient Therapy: Prior Inpatient Therapy Prior Inpatient Therapy: Yes Prior Therapy Dates:  (Feb) Prior Therapy Facilty/Provider(s):  [facility]  Prior Outpatient Therapy: Prior Outpatient Therapy Prior Outpatient Therapy: Yes Prior Therapy Dates:  (last year) Prior Therapy Facilty/Provider(s):  (see above)  Additional Information: Additional Information 1:1 In Past 12 Months?: No CIRT Risk: Yes Elopement Risk: Yes Does patient have medical clearance?: Yes    Objective: Blood pressure 146/77, pulse 76, temperature 98.2 F (36.8 C), temperature source Oral, resp. rate 18, weight 135 lb (61.236 kg), SpO2 93 %.Body mass index is 27.25 kg/(m^2). Results for orders placed or performed during the hospital encounter of 03/24/14 (from the past 72 hour(s))  Basic metabolic panel     Status: Abnormal   Collection Time: 03/26/14 11:49 AM  Result Value Ref Range   Sodium 135 135 - 145 mmol/L    Comment: Please note change in reference range.   Potassium 4.3 3.5 - 5.1 mmol/L    Comment: Please note change in reference range.   Chloride 102 96 - 112 mEq/L   CO2 20 19 - 32 mmol/L   Glucose, Bld 94 70 - 99 mg/dL   BUN 28 (H) 6 - 23 mg/dL   Creatinine, Ser 1.99 (H) 0.50 - 1.10 mg/dL   Calcium 9.3 8.4 - 10.5 mg/dL   GFR calc non Af Amer 22 (L) >90 mL/min   GFR calc Af Amer 25 (L) >90 mL/min    Comment: (NOTE) The eGFR has been calculated using the CKD EPI equation. This calculation has not been validated in all clinical situations. eGFR's persistently <90 mL/min signify possible Chronic Kidney Disease.    Anion gap 13 5 - 15  CBC with Differential     Status: Abnormal   Collection Time: 03/26/14 11:49 AM  Result Value Ref Range   WBC 11.0 (H) 4.0 - 10.5 K/uL   RBC 4.02 3.87 - 5.11 MIL/uL   Hemoglobin 12.5 12.0 - 15.0 g/dL   HCT 39.0 36.0 - 46.0 %    MCV 97.0 78.0 - 100.0 fL   MCH 31.1 26.0 - 34.0 pg   MCHC 32.1 30.0 - 36.0 g/dL   RDW 12.7 11.5 - 15.5 %   Platelets 240 150 - 400 K/uL   Neutrophils Relative % 64 43 - 77 %   Neutro Abs 7.1 1.7 - 7.7 K/uL   Lymphocytes Relative 11 (L) 12 - 46 %   Lymphs Abs 1.2 0.7 - 4.0 K/uL   Monocytes Relative 7 3 - 12 %   Monocytes Absolute 0.7 0.1 - 1.0 K/uL   Eosinophils Relative 18 (H) 0 - 5 %   Eosinophils Absolute 2.0 (H) 0.0 - 0.7 K/uL   Basophils Relative 0 0 - 1 %   Basophils Absolute 0.0 0.0 - 0.1 K/uL  Urinalysis, Routine w reflex microscopic  Status: Abnormal   Collection Time: 03/26/14 12:19 PM  Result Value Ref Range   Color, Urine YELLOW YELLOW   APPearance CLEAR CLEAR   Specific Gravity, Urine 1.009 1.005 - 1.030   pH 5.5 5.0 - 8.0   Glucose, UA NEGATIVE NEGATIVE mg/dL   Hgb urine dipstick TRACE (A) NEGATIVE   Bilirubin Urine NEGATIVE NEGATIVE   Ketones, ur NEGATIVE NEGATIVE mg/dL   Protein, ur NEGATIVE NEGATIVE mg/dL   Urobilinogen, UA 0.2 0.0 - 1.0 mg/dL   Nitrite NEGATIVE NEGATIVE   Leukocytes, UA MODERATE (A) NEGATIVE  Urine microscopic-add on     Status: None   Collection Time: 03/26/14 12:19 PM  Result Value Ref Range   Squamous Epithelial / LPF RARE RARE   WBC, UA 11-20 <3 WBC/hpf   RBC / HPF 0-2 <3 RBC/hpf  Urine culture     Status: None   Collection Time: 03/26/14  6:22 PM  Result Value Ref Range   Specimen Description URINE, RANDOM    Special Requests NONE    Colony Count NO GROWTH Performed at [facility]     Culture NO GROWTH Performed at [facility]     Report Status 03/27/2014 FINAL   CBC     Status: Abnormal   Collection Time: 03/28/14 11:43 AM  Result Value Ref Range   WBC 11.9 (H) 4.0 - 10.5 K/uL   RBC 3.94 3.87 - 5.11 MIL/uL   Hemoglobin 12.3 12.0 - 15.0 g/dL   HCT 38.9 36.0 - 46.0 %   MCV 98.7 78.0 - 100.0 fL   MCH 31.2 26.0 - 34.0 pg   MCHC 31.6 30.0 - 36.0 g/dL   RDW 12.9 11.5 - 15.5 %   Platelets 244 150 - 400  K/uL  Comprehensive metabolic panel     Status: Abnormal   Collection Time: 03/28/14 11:43 AM  Result Value Ref Range   Sodium 134 (L) 135 - 145 mmol/L    Comment: Please note change in reference range.   Potassium 4.1 3.5 - 5.1 mmol/L    Comment: Please note change in reference range.   Chloride 105 96 - 112 mEq/L   CO2 19 19 - 32 mmol/L   Glucose, Bld 107 (H) 70 - 99 mg/dL   BUN 33 (H) 6 - 23 mg/dL   Creatinine, Ser 2.11 (H) 0.50 - 1.10 mg/dL   Calcium 9.2 8.4 - 10.5 mg/dL   Total Protein 6.5 6.0 - 8.3 g/dL   Albumin 3.8 3.5 - 5.2 g/dL   AST 23 0 - 37 U/L   ALT 9 0 - 35 U/L   Alkaline Phosphatase 65 39 - 117 U/L   Total Bilirubin 0.7 0.3 - 1.2 mg/dL   GFR calc non Af Amer 20 (L) >90 mL/min   GFR calc Af Amer 23 (L) >90 mL/min    Comment: (NOTE) The eGFR has been calculated using the CKD EPI equation. This calculation has not been validated in all clinical situations. eGFR's persistently <90 mL/min signify possible Chronic Kidney Disease.    Anion gap 10 5 - 15   Labs are reviewed and are pertinent for medical issues being addressed.  Current Facility-Administered Medications  Medication Dose Route Frequency Provider Last Rate Last Dose  . acetaminophen (TYLENOL) tablet 650 mg  650 mg Oral Q4H PRN Malvin Johns, MD   650 mg at 03/27/14 2144  . cephALEXin (KEFLEX) capsule 500 mg  500 mg Oral Q12H Malvin Johns, MD   500 mg at  03/28/14 0949  . citalopram (CELEXA) tablet 20 mg  20 mg Oral Daily Malvin Johns, MD   20 mg at 03/28/14 0949  . clonazePAM (KLONOPIN) tablet 0.25 mg  0.25 mg Oral BID PRN Malvin Johns, MD   0.25 mg at 03/28/14 1656  . gabapentin (NEURONTIN) capsule 100 mg  100 mg Oral Daily Malvin Johns, MD   100 mg at 03/28/14 0950  . levothyroxine (SYNTHROID, LEVOTHROID) tablet 50 mcg  50 mcg Oral QAC breakfast Malvin Johns, MD   50 mcg at 03/28/14 0825  . metoprolol (LOPRESSOR) tablet 25 mg  25 mg Oral BID Malvin Johns, MD   25 mg at 03/28/14 0950  .  pantoprazole (PROTONIX) EC tablet 40 mg  40 mg Oral Daily Malvin Johns, MD   40 mg at 03/28/14 0949  . rivastigmine (EXELON) 4.6 mg/24hr 4.6 mg  4.6 mg Transdermal Daily Malvin Johns, MD   4.6 mg at 03/28/14 3154   Current Outpatient Prescriptions  Medication Sig Dispense Refill  . acetaminophen (TYLENOL) 325 MG tablet Take 650 mg by mouth every 6 (six) hours as needed for mild pain.     . citalopram (CELEXA) 20 MG tablet Take 20 mg by mouth daily.    . clonazePAM (KLONOPIN) 0.5 MG tablet Take 0.25 mg by mouth 2 (two) times daily as needed for anxiety.    . gabapentin (NEURONTIN) 100 MG capsule Take 100 mg by mouth daily.     Marland Kitchen guaifenesin (ROBITUSSIN) 100 MG/5ML syrup Take 200 mg by mouth 4 (four) times daily as needed for cough.    . levothyroxine (SYNTHROID, LEVOTHROID) 50 MCG tablet Take 50 mcg by mouth daily before breakfast.    . loperamide (IMODIUM) 2 MG capsule Take 2 mg by mouth as needed for diarrhea or loose stools.    . Melatonin ([employer] SLEEP AID) 5 MG TABS Take 5 mg by mouth at bedtime as needed (for sleep).    . metoprolol tartrate (LOPRESSOR) 25 MG tablet Take 12.5 mg by mouth 2 (two) times daily.     . pantoprazole (PROTONIX) 40 MG tablet Take 40 mg by mouth 2 (two) times daily.     . rivastigmine (EXELON) 4.6 mg/24hr Place 4.6 mg onto the skin daily.      Psychiatric Specialty Exam:     Blood pressure 146/77, pulse 76, temperature 98.2 F (36.8 C), temperature source Oral, resp. rate 18, weight 135 lb (61.236 kg), SpO2 93 %.Body mass index is 27.25 kg/(m^2).  General Appearance: Casual  Eye Contact::  Good  Speech:  Normal Rate  Volume:  Normal  Mood:  Anxious  Affect:  Congruent  Thought Process:  Confused at times  Orientation:  Other:  alert to self and daughter  Thought Content:  WDL  Suicidal Thoughts:  No  Homicidal Thoughts:  No  Memory:  Immediate;   Poor Recent;   Poor Remote;   Poor  Judgement:  Poor  Insight:  Lacking  Psychomotor Activity:   Decreased  Concentration:  Poor  Recall:  Poor  Fund of Knowledge:Fair  Language: Fair  Akathisia:  No  Handed:  Right  AIMS (if indicated):     Assets:  Financial Resources/Insurance Leisure Time Resilience Social Support  Sleep:      Musculoskeletal: Strength & Muscle Tone: decreased Gait & Station: did not ambulate on assessment Patient leans: N/A  Treatment Plan Summary: Daily contact with patient to assess and evaluate symptoms and progress in treatment Medication management; admit to inpatient gero-psychiatry  Waylan Boga, PMHNP-BC 03/28/2014 5:54 PM   Patient seen, evaluated and I agree with notes by Nurse Practitioner. Corena Pilgrim, MD

## 2014-03-28 NOTE — ED Notes (Signed)
Patient refusing to eat after 3 attempts.  Patient's bed returned to lower position and patient re-positioned.

## 2014-03-28 NOTE — ED Notes (Signed)
Patient resting comfortably at shift change.  Patient responsive to voice.  Patient oriented to person and place, but not time.  Patient knows she is in hospital, but is uncertain as to why.  Patient stated she uses a bed pan to urinate.  Patient does not need to toilet currently.  Patient denies SI/HI/AVH and states she feels safe here.

## 2014-03-29 DIAGNOSIS — F3170 Bipolar disorder, currently in remission, most recent episode unspecified: Secondary | ICD-10-CM | POA: Insufficient documentation

## 2014-03-29 DIAGNOSIS — F0390 Unspecified dementia without behavioral disturbance: Secondary | ICD-10-CM | POA: Diagnosis present

## 2014-03-29 DIAGNOSIS — F319 Bipolar disorder, unspecified: Secondary | ICD-10-CM

## 2014-03-29 DIAGNOSIS — R4182 Altered mental status, unspecified: Secondary | ICD-10-CM | POA: Diagnosis not present

## 2014-03-29 MED ORDER — CEPHALEXIN 500 MG PO CAPS: 500.0000 mg | ORAL_CAPSULE | Freq: Two times a day (BID) | ORAL | Status: AC

## 2014-03-29 NOTE — Progress Notes (Signed)
ED CM spoke with TCU staff about pt. Family recently left after speaking with SW. [facility].  No Pt consult orders in EPIC noted. [facility]. No bed placement to geriatric facility available. Not meeting criteria. Cm noted son wanting to "increase services" Cm called son inquired if Cm could assist with home health.  CM confirmed with Raiford NobleRick [facility] He agreed for pt to have home health RN, PT, OT, aide, SW. He report [place] is [facility] way [facility].  Reminded him of discussion of home health and PDNs. He state he wants to speak with [facility] helping [facility] with [facility]. Raiford NobleRick wanted to confirm again home health is covered by medicare and start date.Cm discussed services are covered and HH agency will provided start date and frequency Raiford NobleRick inquired if [facility] until home health started informed him pt is already scheduled for d/c  1352 Cm spoke with Marcelle [facility]ilingNatasha at [facility] to inquire about preferred home health agency. [facility] Natasha reports pt baseline before "[place] day" "thanksgiving" was that pt dressed, bathed, walked and put on her own make up. "She was a true assisted living patient" Since then has been noted "to holler out" and on occasion "walk out on unit naked".  Pt's medications for behavior has been adjusted per natasha by [facility].  Reports she believes pt is on the [facility] list to get increased services "we have some staff of our own" 1409 EDP notified of need for home health orders and face to face  1417 Left voice message for Jorja Loaim  [phone] [facility]  1419 Spoke with Marlin CanaryMary Y, [facility] (assisting Tim) [phone] Provided home referral

## 2014-03-29 NOTE — Consult Note (Signed)
[facility] Face-to-Face Psychiatry Consult   Reason for Consult:  Psychosis Referring Physician:  EDP  [name] is an 81 y.o. female. Total Time spent with patient: 30 minutes  Assessment: AXIS I:  Dementia; bipolar affective disorder AXIS II:  Deferred AXIS III:   Past Medical History  Diagnosis Date  . Stomach irritation   . Diverticulitis   . Hypertension   . Depression   . Hypothyroid   . GERD (gastroesophageal reflux disease)   . Narcotic abuse     Vicodin - last use 07/2012  . Chronic diarrhea   . H/O small bowel obstruction   . Acute kidney failure   . UTI (lower urinary tract infection)   . Episodic mood disorder   . Anxiety   . Insomnia   . Debility   . Generalized pain    AXIS IV:  chronic UTIs, decline in physical health AXIS V:  70; mild symptoms  Discharge back to her assisted living facility, follow-up with out-patient provider.  Subjective:   [name] is a 81 y.o. female patient has stabilized medically and psychiatrically.  HPI:  The patient has been calm and cooperative for the past few days.  Cleared medically by the EDP and psychiatrically by the psychiatrist.  The family wanted a 3 day admission so she would have rehab or a SNF paid for by Medicare but admission not warranted.  Her son is her POA and made many attempts to get her admitted medically or physically.  The bottom line is the patient lives in an assisted living facility but needs a higher level of care because she can not perform her ADLs independently or feed herself.  This was occurring prior to admission.  Patient denies suicidal/homicidal ideations, hallucinations, and alcohol/drug abuse--none since admission to the ED.    HPI Elements:   Location:  generalized. Quality:  acute. Severity:  moderate. Timing:  constant. Duration:  10 days. Context:  UTI.  Past Psychiatric History: Past Medical History  Diagnosis Date  . Stomach irritation   . Diverticulitis   . Hypertension    . Depression   . Hypothyroid   . GERD (gastroesophageal reflux disease)   . Narcotic abuse     Vicodin - last use 07/2012  . Chronic diarrhea   . H/O small bowel obstruction   . Acute kidney failure   . UTI (lower urinary tract infection)   . Episodic mood disorder   . Anxiety   . Insomnia   . Debility   . Generalized pain     reports that she has never smoked. She has never used smokeless tobacco. She reports that she does not drink alcohol or use illicit drugs. Family History  Problem Relation Age of Onset  . Hypertension Sister   . Hyperlipidemia Neg Hx   . Diabetes Neg Hx    Family History Substance Abuse: No Family Supports: Yes, List: (son, dgtr) Living Arrangements:  (assisted living) Can pt return to current living arrangement?: Yes Abuse/Neglect [facility]) Physical Abuse: Denies Verbal Abuse: Denies Sexual Abuse: Denies Allergies:   Allergies  Allergen Reactions  . Ace Inhibitors Cough  . Morphine And Related     unknown  . Oxymorphone     unknown  . Quetiapine     Dementia like symptoms    ACT Assessment Complete:  Yes:    Educational Status    Risk to Self: Risk to self with the past 6 months Suicidal Ideation: No Suicidal Intent: No Is  patient at risk for suicide?: No Suicidal Plan?: No Access to Means: No What has been your use of drugs/alcohol within the last 12 months?:  (none) Previous Attempts/Gestures: Yes How many times?:  (1x) Other Self Harm Risks:  (walking ) Triggers for Past Attempts: Unknown Intentional Self Injurious Behavior: None Family Suicide History: Unknown Recent stressful life event(s):  (none known) Persecutory voices/beliefs?: No Depression: Yes Depression Symptoms: Insomnia, Feeling angry/irritable, Isolating Substance abuse history and/or treatment for substance abuse?: No Suicide prevention information given to non-admitted patients: Not applicable  Risk to Others: Risk to Others within the past 6 months Homicidal  Ideation: No Thoughts of Harm to Others: No Current Homicidal Intent: No Current Homicidal Plan: No Access to Homicidal Means: No History of harm to others?: No Assessment of Violence:  (struggling against son, daughter-aggitation) Does patient have access to weapons?: No Criminal Charges Pending?: No Does patient have a court date: No  Abuse: Abuse/Neglect Assessment (Assessment to be complete while patient is alone) Physical Abuse: Denies Verbal Abuse: Denies Sexual Abuse: Denies Exploitation of patient/patient's resources: Denies Self-Neglect: Denies  Prior Inpatient Therapy: Prior Inpatient Therapy Prior Inpatient Therapy: Yes Prior Therapy Dates:  (Feb) Prior Therapy Facilty/Provider(s):  [facility])  Prior Outpatient Therapy: Prior Outpatient Therapy Prior Outpatient Therapy: Yes Prior Therapy Dates:  (last year) Prior Therapy Facilty/Provider(s):  (see above)  Additional Information: Additional Information 1:1 In Past 12 Months?: No CIRT Risk: Yes Elopement Risk: Yes Does patient have medical clearance?: Yes    Objective: Blood pressure 160/75, pulse 68, temperature 98.3 F (36.8 C), temperature source Oral, resp. rate 20, weight 135 lb (61.236 kg), SpO2 92 %.Body mass index is 27.25 kg/(m^2). Results for orders placed or performed during the hospital encounter of 03/24/14 (from the past 72 hour(s))  Urine culture     Status: None   Collection Time: 03/26/14  6:22 PM  Result Value Ref Range   Specimen Description URINE, RANDOM    Special Requests NONE    Colony Count NO GROWTH Performed at [facility]     Culture NO GROWTH Performed at [facility]     Report Status 03/27/2014 FINAL   CBC     Status: Abnormal   Collection Time: 03/28/14 11:43 AM  Result Value Ref Range   WBC 11.9 (H) 4.0 - 10.5 K/uL   RBC 3.94 3.87 - 5.11 MIL/uL   Hemoglobin 12.3 12.0 - 15.0 g/dL   HCT 38.9 36.0 - 46.0 %   MCV 98.7 78.0 - 100.0 fL   MCH 31.2 26.0 - 34.0  pg   MCHC 31.6 30.0 - 36.0 g/dL   RDW 12.9 11.5 - 15.5 %   Platelets 244 150 - 400 K/uL  Comprehensive metabolic panel     Status: Abnormal   Collection Time: 03/28/14 11:43 AM  Result Value Ref Range   Sodium 134 (L) 135 - 145 mmol/L    Comment: Please note change in reference range.   Potassium 4.1 3.5 - 5.1 mmol/L    Comment: Please note change in reference range.   Chloride 105 96 - 112 mEq/L   CO2 19 19 - 32 mmol/L   Glucose, Bld 107 (H) 70 - 99 mg/dL   BUN 33 (H) 6 - 23 mg/dL   Creatinine, Ser 2.11 (H) 0.50 - 1.10 mg/dL   Calcium 9.2 8.4 - 10.5 mg/dL   Total Protein 6.5 6.0 - 8.3 g/dL   Albumin 3.8 3.5 - 5.2 g/dL   AST 23 0 -  37 U/L   ALT 9 0 - 35 U/L   Alkaline Phosphatase 65 39 - 117 U/L   Total Bilirubin 0.7 0.3 - 1.2 mg/dL   GFR calc non Af Amer 20 (L) >90 mL/min   GFR calc Af Amer 23 (L) >90 mL/min    Comment: (NOTE) The eGFR has been calculated using the CKD EPI equation. This calculation has not been validated in all clinical situations. eGFR's persistently <90 mL/min signify possible Chronic Kidney Disease.    Anion gap 10 5 - 15   Labs are reviewed and are pertinent for medical issues being addressed.  Current Facility-Administered Medications  Medication Dose Route Frequency Provider Last Rate Last Dose  . acetaminophen (TYLENOL) tablet 650 mg  650 mg Oral Q4H PRN Malvin Johns, MD   650 mg at 03/28/14 2126  . cephALEXin (KEFLEX) capsule 500 mg  500 mg Oral Q12H Malvin Johns, MD   500 mg at 03/29/14 1047  . citalopram (CELEXA) tablet 20 mg  20 mg Oral Daily Malvin Johns, MD   20 mg at 03/29/14 1046  . clonazePAM (KLONOPIN) tablet 0.25 mg  0.25 mg Oral BID PRN Malvin Johns, MD   0.25 mg at 03/28/14 2100  . gabapentin (NEURONTIN) capsule 100 mg  100 mg Oral Daily Malvin Johns, MD   100 mg at 03/29/14 1046  . levothyroxine (SYNTHROID, LEVOTHROID) tablet 50 mcg  50 mcg Oral QAC breakfast Malvin Johns, MD   50 mcg at 03/29/14 0807  . metoprolol (LOPRESSOR)  tablet 25 mg  25 mg Oral BID Malvin Johns, MD   25 mg at 03/29/14 1050  . pantoprazole (PROTONIX) EC tablet 40 mg  40 mg Oral Daily Malvin Johns, MD   40 mg at 03/29/14 1046  . rivastigmine (EXELON) 4.6 mg/24hr 4.6 mg  4.6 mg Transdermal Daily Malvin Johns, MD   4.6 mg at 03/29/14 1050   Current Outpatient Prescriptions  Medication Sig Dispense Refill  . acetaminophen (TYLENOL) 325 MG tablet Take 650 mg by mouth every 6 (six) hours as needed for mild pain.     . citalopram (CELEXA) 20 MG tablet Take 20 mg by mouth daily.    . clonazePAM (KLONOPIN) 0.5 MG tablet Take 0.25 mg by mouth 2 (two) times daily as needed for anxiety.    . gabapentin (NEURONTIN) 100 MG capsule Take 100 mg by mouth daily.     Marland Kitchen guaifenesin (ROBITUSSIN) 100 MG/5ML syrup Take 200 mg by mouth 4 (four) times daily as needed for cough.    . levothyroxine (SYNTHROID, LEVOTHROID) 50 MCG tablet Take 50 mcg by mouth daily before breakfast.    . loperamide (IMODIUM) 2 MG capsule Take 2 mg by mouth as needed for diarrhea or loose stools.    . Melatonin ([employer] SLEEP AID) 5 MG TABS Take 5 mg by mouth at bedtime as needed (for sleep).    . metoprolol tartrate (LOPRESSOR) 25 MG tablet Take 12.5 mg by mouth 2 (two) times daily.     . pantoprazole (PROTONIX) 40 MG tablet Take 40 mg by mouth 2 (two) times daily.     . rivastigmine (EXELON) 4.6 mg/24hr Place 4.6 mg onto the skin daily.    . cephALEXin (KEFLEX) 500 MG capsule Take 1 capsule (500 mg total) by mouth every 12 (twelve) hours. 5 capsule 0    Psychiatric Specialty Exam:     Blood pressure 160/75, pulse 68, temperature 98.3 F (36.8 C), temperature source Oral, resp. rate 20, weight 135 lb (61.236  kg), SpO2 92 %.Body mass index is 27.25 kg/(m^2).  General Appearance: Casual  Eye Contact::  Good  Speech:  Normal Rate  Volume:  Normal  Mood:  Anxious  Affect:  Congruent  Thought Process:  Confused at times  Orientation:  Other:  alert to self and daughter  Thought  Content:  WDL  Suicidal Thoughts:  No  Homicidal Thoughts:  No  Memory:  Immediate;   Poor Recent;   Poor Remote;   Poor  Judgement:  Poor  Insight:  Lacking  Psychomotor Activity:  Decreased  Concentration:  Poor  Recall:  Poor  Fund of Knowledge:Fair  Language: Fair  Akathisia:  No  Handed:  Right  AIMS (if indicated):     Assets:  Financial Resources/Insurance Leisure Time Resilience Social Support  Sleep:      Musculoskeletal: Strength & Muscle Tone: decreased Gait & Station: did not ambulate on assessment Patient leans: N/A  Treatment Plan Summary: Discharge back to her assisted living facility, family or additional assistance may be required, follow-up with her regular out-patient providers.  Waylan Boga, PMHNP-BC 03/29/2014 6:05 PM   Patient seen, evaluated and I agree with notes by Nurse Practitioner. Corena Pilgrim, MD

## 2014-03-29 NOTE — ED Notes (Addendum)
Marc[facility]asha[facility], informed this RN that the Pt may not be able to return, due to acuity.  This RN contacted CSW for assistance.

## 2014-03-29 NOTE — Progress Notes (Signed)
Clinical Social Work  CSW received a call from Psych NP reporting that patient has been psychiatrically and medically cleared to DC today. CSW met with patient and dtr at bedside. Dtr confirms that patient lives at [facility] Dtr reports she and brother have questions about DC and brother is POA. POA plans to be at hospital at 12:30pm for meeting to discuss DC plans. Bedside RN aware of plans as well.  Sindy Messing, LCSW (Coverage for [employer])

## 2014-03-29 NOTE — ED Notes (Signed)
Grandson visiting with patient.

## 2014-03-29 NOTE — Progress Notes (Signed)
Clinical Social Work  CSW met with POA (Judson) and dtr in conference room. CSW explained that patient is medically and psychiatrically cleared to DC today. POA reports he is concerned about patient's care and feels inpatient psych hospitalization is needed. CSW spoke with psych NP who reports no need for psych hospitalization at this time and CSW made family aware. CSW explained patient is ready to DC today. Family had questions about SNF placement and reports patient has been in the past and would like her to return. CSW explained no qualifying stay so Medicare could not cover SNF placement. CSW offered to find SNF placement but family would have to pay privately. Family reports they cannot afford private pay because patient only makes around $3600/month and was informed she did not qualify for Medicaid. POA asked if CSW could check again to see if patient qualifies for Medicaid now. CSW explained that family could follow up with the Department of Social Services re: IT sales professional. POA reports he will go talk with ALF about increasing care. CSW made ED MD aware of DC plans. CSW will fax FL2 and DC summary to ALF once MD has completed note. CSW spoke with Ronny Bacon at ALF who reports that information can be faxed to [phone].   Sindy Messing, LCSW  (Coverage for [employer])

## 2014-03-29 NOTE — Discharge Instructions (Signed)
Urine Culture Collection, Female  You will collect a sample of pee (urine) in a cup. Read the instructions below before beginning. If you have any questions, ask the nurse before you begin. Follow the instructions carefully.  Wash your hands with soap and water and dry them thoroughly.  Open the lid of the cup. Be careful not to touch the inside.  Clean the private (genital) area.  Sit over the toilet. Use the fingers of one hand to separate and hold open the folds of the skin in your private area.  Clean the pee (urinary) opening and surrounding area with the gauze, wiping from front to back. Throw away the gauze in the trash, not the toilet. Repeat this step two times.  With the folds of skin still separated, pee a small amount into toilet. STOP, then pee into the cup. Fill the cup half way.  Put the lid on the cup tightly.  Wash your hands with soap and water.  If you were given a label, put the label on the cup.  Give the cup to the nurse. Document Released: 02/14/2008 Document Revised: 02/18/2012 Document Reviewed: 02/14/2008 [facility] Patient Information 2015 [place], [place]. This information is not intended to replace advice given to you by your health care provider. Make sure you discuss any questions you have with your health care provider.  Urinary Tract Infection Urinary tract infections (UTIs) can develop anywhere along your urinary tract. Your urinary tract is your body's drainage system for removing wastes and extra water. Your urinary tract includes two kidneys, two ureters, a bladder, and a urethra. Your kidneys are a pair of bean-shaped organs. Each kidney is about the size of your fist. They are located below your ribs, one on each side of your spine. CAUSES Infections are caused by microbes, which are microscopic organisms, including fungi, viruses, and bacteria. These organisms are so small that they can only be seen through a microscope. Bacteria are the microbes that  most commonly cause UTIs. SYMPTOMS  Symptoms of UTIs may vary by age and gender of the patient and by the location of the infection. Symptoms in young women typically include a frequent and intense urge to urinate and a painful, burning feeling in the bladder or urethra during urination. Older women and men are more likely to be tired, shaky, and weak and have muscle aches and abdominal pain. A fever may mean the infection is in your kidneys. Other symptoms of a kidney infection include pain in your back or sides below the ribs, nausea, and vomiting. DIAGNOSIS To diagnose a UTI, your caregiver will ask you about your symptoms. Your caregiver also will ask to provide a urine sample. The urine sample will be tested for bacteria and white blood cells. White blood cells are made by your body to help fight infection. TREATMENT  Typically, UTIs can be treated with medication. Because most UTIs are caused by a bacterial infection, they usually can be treated with the use of antibiotics. The choice of antibiotic and length of treatment depend on your symptoms and the type of bacteria causing your infection. HOME CARE INSTRUCTIONS  If you were prescribed antibiotics, take them exactly as your caregiver instructs you. Finish the medication even if you feel better after you have only taken some of the medication.  Drink enough water and fluids to keep your urine clear or pale yellow.  Avoid caffeine, tea, and carbonated beverages. They tend to irritate your bladder.  Empty your bladder often. Avoid holding  urine for long periods of time.  Empty your bladder before and after sexual intercourse.  After a bowel movement, women should cleanse from front to back. Use each tissue only once. SEEK MEDICAL CARE IF:   You have back pain.  You develop a fever.  Your symptoms do not begin to resolve within 3 days. SEEK IMMEDIATE MEDICAL CARE IF:   You have severe back pain or lower abdominal pain.  You  develop chills.  You have nausea or vomiting.  You have continued burning or discomfort with urination. MAKE SURE YOU:   Understand these instructions.  Will watch your condition.  Will get help right away if you are not doing well or get worse. Document Released: 12/11/2004 Document Revised: 09/02/2011 Document Reviewed: 04/11/2011 [facility] Patient Information 2015 [place]. This information is not intended to replace advice given to you by your health care provider. Make sure you discuss any questions you have with your health care provider.

## 2014-03-29 NOTE — Progress Notes (Signed)
CSW consulted with nurse who states she will be calling [facility] for transportation. [facility].  Trish MageBrittney Wilhelmenia Addis, LCSWA [phone] ED CSW 03/29/2014 2:57 PM

## 2014-03-29 NOTE — Progress Notes (Signed)
CSW was consulted by nurse [facility] because she was feed by staff.  [facility] and spoke nurse April. CSW faxed the patients discharge orders and signed FL2 to facility. Facility will also be given a hard copy of the documents via [facility]. CSW confirmed with nurse that their facility did not need anymore clinicals or paperwork. Nurse agreed that they have recieved all the proper documentation. Nurse states that patient is now welcomed back to the facility.  [facility] is [facility] being [facility].   Trish MageBrittney Ayauna Mcnay, LCSWA [phone] ED CSW 03/29/2014 4:07 PM

## 2014-03-29 NOTE — ED Notes (Signed)
Social Work at bedside 

## 2014-03-29 NOTE — ED Notes (Signed)
In order for the patient to get from the bed  to the chair she needed two person assistance. Also during meal times she had to be feed.

## 2014-03-29 NOTE — ED Notes (Addendum)
Per NT, Pt ate 50% of breakfast and 25% of lunch.  No belongings can be found for Pt.

## 2014-03-29 NOTE — ED Notes (Signed)
Per Psych team, Pt is cleared and needs to be referred to Social Work for placement.

## 2014-03-29 NOTE — [facility] Suicide Risk Assessment (Signed)
Suicide Risk Assessment  Discharge Assessment     Demographic Factors:  Age 81 or older and Caucasian  Total Time spent with patient: 30 minutes   Psychiatric Specialty Exam:     Blood pressure 160/75, pulse 68, temperature 98.3 F (36.8 C), temperature source Oral, resp. rate 20, weight 135 lb (61.236 kg), SpO2 92 %.Body mass index is 27.25 kg/(m^2).  General Appearance: Casual  Eye Contact::  Good  Speech:  Normal Rate  Volume:  Normal  Mood:  Anxious  Affect:  Congruent  Thought Process:  Confused at times  Orientation:  Other:  alert to self and daughter  Thought Content:  WDL  Suicidal Thoughts:  No  Homicidal Thoughts:  No  Memory:  Immediate;   Poor Recent;   Poor Remote;   Poor  Judgement:  Poor  Insight:  Lacking  Psychomotor Activity:  Decreased  Concentration:  Poor  Recall:  Poor  Fund of Knowledge:Fair  Language: Fair  Akathisia:  No  Handed:  Right  AIMS (if indicated):     Assets:  Financial Resources/Insurance Leisure Time Resilience Social Support  Sleep:      Musculoskeletal: Strength & Muscle Tone: decreased Gait & Station: did not ambulate on assessment Patient leans: N/A  Mental Status Per Nursing Assessment::   On Admission:   UTI, AMS  Current Mental Status by Physician: NA  Loss Factors: NA  Historical Factors: NA  Risk Reduction Factors:   Sense of responsibility to family, Living with another person, especially a relative, Positive social support and Positive therapeutic relationship  Continued Clinical Symptoms:  Confusion at times, congruent with her dementia  Cognitive Features That Contribute To Risk:  None   Suicide Risk:  Minimal: No identifiable suicidal ideation.  Patients presenting with no risk factors but with morbid ruminations; may be classified as minimal risk based on the severity of the depressive symptoms  Discharge Diagnoses:   AXIS I:  Bipolar affective disorder, dementia AXIS II:  Deferred AXIS  III:   Past Medical History  Diagnosis Date  . Stomach irritation   . Diverticulitis   . Hypertension   . Depression   . Hypothyroid   . GERD (gastroesophageal reflux disease)   . Narcotic abuse     Vicodin - last use 07/2012  . Chronic diarrhea   . H/O small bowel obstruction   . Acute kidney failure   . UTI (lower urinary tract infection)   . Episodic mood disorder   . Anxiety   . Insomnia   . Debility   . Generalized pain    AXIS IV:  other psychosocial or environmental problems and problems related to social environment AXIS V:  61-70 mild symptoms  Plan Of Care/Follow-up recommendations:  Activity:  as tolerated Diet:  heart healthy diet  Is patient on multiple antipsychotic therapies at discharge:  No   Has Patient had three or more failed trials of antipsychotic monotherapy by history:  No  Recommended Plan for Multiple Antipsychotic Therapies: NA  LORD, JAMISON, PMH-NP 03/29/2014, 1:39 PM

## 2014-03-29 NOTE — ED Notes (Signed)
CSW reports that the Pt is cleared to return to facility.

## 2016-04-17 DEATH — deceased
